# Patient Record
Sex: Male | Born: 1945 | Race: White | Hispanic: No | Marital: Married | Smoking: Never smoker
Health system: Southern US, Community
[De-identification: ages and names within clinical notes are randomized; demographics above are authoritative.]

## PROBLEM LIST (undated history)

## (undated) DIAGNOSIS — R918 Other nonspecific abnormal finding of lung field: Secondary | ICD-10-CM

## (undated) DIAGNOSIS — I77810 Thoracic aortic ectasia: Secondary | ICD-10-CM

## (undated) DIAGNOSIS — E669 Obesity, unspecified: Secondary | ICD-10-CM

## (undated) DIAGNOSIS — C801 Malignant (primary) neoplasm, unspecified: Secondary | ICD-10-CM

## (undated) DIAGNOSIS — Z85048 Personal history of other malignant neoplasm of rectum, rectosigmoid junction, and anus: Secondary | ICD-10-CM

## (undated) DIAGNOSIS — I255 Ischemic cardiomyopathy: Secondary | ICD-10-CM

## (undated) DIAGNOSIS — H269 Unspecified cataract: Secondary | ICD-10-CM

## (undated) DIAGNOSIS — E041 Nontoxic single thyroid nodule: Secondary | ICD-10-CM

## (undated) DIAGNOSIS — N400 Enlarged prostate without lower urinary tract symptoms: Secondary | ICD-10-CM

## (undated) DIAGNOSIS — H409 Unspecified glaucoma: Secondary | ICD-10-CM

## (undated) DIAGNOSIS — Z85528 Personal history of other malignant neoplasm of kidney: Secondary | ICD-10-CM

## (undated) DIAGNOSIS — I5022 Chronic systolic (congestive) heart failure: Secondary | ICD-10-CM

## (undated) DIAGNOSIS — T7840XA Allergy, unspecified, initial encounter: Secondary | ICD-10-CM

## (undated) DIAGNOSIS — Z961 Presence of intraocular lens: Secondary | ICD-10-CM

## (undated) DIAGNOSIS — N189 Chronic kidney disease, unspecified: Secondary | ICD-10-CM

## (undated) HISTORY — DX: Unspecified cataract: H26.9

## (undated) HISTORY — DX: Personal history of other malignant neoplasm of kidney: Z85.528

## (undated) HISTORY — DX: Thoracic aortic ectasia: I77.810

## (undated) HISTORY — DX: Unspecified glaucoma: H40.9

## (undated) HISTORY — DX: Allergy, unspecified, initial encounter: T78.40XA

## (undated) HISTORY — DX: Benign prostatic hyperplasia without lower urinary tract symptoms: N40.0

## (undated) HISTORY — PX: COLONOSCOPY: SHX174

## (undated) HISTORY — PX: KIDNEY SURGERY: SHX687

## (undated) HISTORY — DX: Personal history of other malignant neoplasm of rectum, rectosigmoid junction, and anus: Z85.048

## (undated) HISTORY — PX: EYE SURGERY: SHX253

## (undated) HISTORY — PX: FINGER SURGERY: SHX640

---

## 2011-04-01 ENCOUNTER — Encounter (INDEPENDENT_AMBULATORY_CARE_PROVIDER_SITE_OTHER): Payer: BC Managed Care – PPO | Admitting: Ophthalmology

## 2011-04-01 DIAGNOSIS — H33309 Unspecified retinal break, unspecified eye: Secondary | ICD-10-CM

## 2011-04-01 DIAGNOSIS — H33009 Unspecified retinal detachment with retinal break, unspecified eye: Secondary | ICD-10-CM

## 2011-04-01 DIAGNOSIS — H2510 Age-related nuclear cataract, unspecified eye: Secondary | ICD-10-CM

## 2011-04-01 DIAGNOSIS — H43819 Vitreous degeneration, unspecified eye: Secondary | ICD-10-CM

## 2011-04-29 ENCOUNTER — Encounter (INDEPENDENT_AMBULATORY_CARE_PROVIDER_SITE_OTHER): Payer: BC Managed Care – PPO | Admitting: Ophthalmology

## 2012-04-16 DIAGNOSIS — H401190 Primary open-angle glaucoma, unspecified eye, stage unspecified: Secondary | ICD-10-CM

## 2012-04-16 HISTORY — DX: Primary open-angle glaucoma, unspecified eye, stage unspecified: H40.1190

## 2012-04-30 DIAGNOSIS — H3321 Serous retinal detachment, right eye: Secondary | ICD-10-CM | POA: Insufficient documentation

## 2012-04-30 DIAGNOSIS — H33302 Unspecified retinal break, left eye: Secondary | ICD-10-CM | POA: Insufficient documentation

## 2012-04-30 DIAGNOSIS — H43812 Vitreous degeneration, left eye: Secondary | ICD-10-CM | POA: Insufficient documentation

## 2012-04-30 HISTORY — DX: Unspecified retinal break, left eye: H33.302

## 2012-05-21 DIAGNOSIS — E669 Obesity, unspecified: Secondary | ICD-10-CM

## 2012-05-21 HISTORY — DX: Obesity, unspecified: E66.9

## 2012-09-13 DIAGNOSIS — Z961 Presence of intraocular lens: Secondary | ICD-10-CM

## 2012-09-13 HISTORY — DX: Presence of intraocular lens: Z96.1

## 2012-11-01 DIAGNOSIS — Z961 Presence of intraocular lens: Secondary | ICD-10-CM | POA: Insufficient documentation

## 2012-11-01 HISTORY — DX: Presence of intraocular lens: Z96.1

## 2013-01-24 DIAGNOSIS — H2512 Age-related nuclear cataract, left eye: Secondary | ICD-10-CM | POA: Insufficient documentation

## 2013-01-24 HISTORY — DX: Age-related nuclear cataract, left eye: H25.12

## 2013-05-09 DIAGNOSIS — H35342 Macular cyst, hole, or pseudohole, left eye: Secondary | ICD-10-CM

## 2013-05-09 DIAGNOSIS — H35372 Puckering of macula, left eye: Secondary | ICD-10-CM

## 2013-05-09 HISTORY — DX: Puckering of macula, left eye: H35.372

## 2013-05-09 HISTORY — DX: Macular cyst, hole, or pseudohole, left eye: H35.342

## 2016-07-04 DIAGNOSIS — Z923 Personal history of irradiation: Secondary | ICD-10-CM

## 2016-07-04 HISTORY — DX: Personal history of irradiation: Z92.3

## 2017-04-03 DIAGNOSIS — C801 Malignant (primary) neoplasm, unspecified: Secondary | ICD-10-CM | POA: Insufficient documentation

## 2017-04-03 DIAGNOSIS — E041 Nontoxic single thyroid nodule: Secondary | ICD-10-CM | POA: Insufficient documentation

## 2017-04-03 DIAGNOSIS — N189 Chronic kidney disease, unspecified: Secondary | ICD-10-CM

## 2017-04-03 DIAGNOSIS — R918 Other nonspecific abnormal finding of lung field: Secondary | ICD-10-CM

## 2017-04-03 HISTORY — PX: COLONOSCOPY: SHX174

## 2017-04-03 HISTORY — DX: Other nonspecific abnormal finding of lung field: R91.8

## 2017-04-03 HISTORY — DX: Malignant (primary) neoplasm, unspecified: C80.1

## 2017-04-03 HISTORY — DX: Chronic kidney disease, unspecified: N18.9

## 2017-04-03 HISTORY — DX: Nontoxic single thyroid nodule: E04.1

## 2017-04-05 DIAGNOSIS — N2889 Other specified disorders of kidney and ureter: Secondary | ICD-10-CM | POA: Insufficient documentation

## 2017-04-05 DIAGNOSIS — R935 Abnormal findings on diagnostic imaging of other abdominal regions, including retroperitoneum: Secondary | ICD-10-CM | POA: Insufficient documentation

## 2017-04-05 DIAGNOSIS — R1084 Generalized abdominal pain: Secondary | ICD-10-CM | POA: Insufficient documentation

## 2017-04-05 HISTORY — DX: Other specified disorders of kidney and ureter: N28.89

## 2017-04-05 HISTORY — DX: Generalized abdominal pain: R10.84

## 2017-04-05 HISTORY — DX: Abnormal findings on diagnostic imaging of other abdominal regions, including retroperitoneum: R93.5

## 2017-04-18 DIAGNOSIS — C20 Malignant neoplasm of rectum: Secondary | ICD-10-CM | POA: Insufficient documentation

## 2017-04-18 HISTORY — DX: Malignant neoplasm of rectum: C20

## 2017-05-03 DIAGNOSIS — Z5111 Encounter for antineoplastic chemotherapy: Secondary | ICD-10-CM

## 2017-05-03 HISTORY — DX: Encounter for antineoplastic chemotherapy: Z51.11

## 2017-06-02 DIAGNOSIS — E042 Nontoxic multinodular goiter: Secondary | ICD-10-CM

## 2017-06-02 DIAGNOSIS — R918 Other nonspecific abnormal finding of lung field: Secondary | ICD-10-CM | POA: Insufficient documentation

## 2017-06-02 HISTORY — DX: Nontoxic multinodular goiter: E04.2

## 2017-06-06 ENCOUNTER — Other Ambulatory Visit: Payer: Self-pay | Admitting: Urology

## 2017-06-06 DIAGNOSIS — N2889 Other specified disorders of kidney and ureter: Secondary | ICD-10-CM

## 2017-06-07 ENCOUNTER — Other Ambulatory Visit: Payer: BC Managed Care – PPO

## 2017-06-14 DIAGNOSIS — E041 Nontoxic single thyroid nodule: Secondary | ICD-10-CM | POA: Insufficient documentation

## 2017-06-14 HISTORY — DX: Nontoxic single thyroid nodule: E04.1

## 2017-06-15 ENCOUNTER — Ambulatory Visit
Admission: RE | Admit: 2017-06-15 | Discharge: 2017-06-15 | Disposition: A | Discharge status: Home / Self Care | Payer: Medicare Other | Source: Ambulatory Visit | Attending: Urology | Admitting: Urology

## 2017-06-15 DIAGNOSIS — N2889 Other specified disorders of kidney and ureter: Secondary | ICD-10-CM

## 2017-06-15 HISTORY — DX: Other nonspecific abnormal finding of lung field: R91.8

## 2017-06-15 HISTORY — DX: Chronic kidney disease, unspecified: N18.9

## 2017-06-15 HISTORY — DX: Presence of intraocular lens: Z96.1

## 2017-06-15 HISTORY — DX: Obesity, unspecified: E66.9

## 2017-06-15 HISTORY — DX: Malignant (primary) neoplasm, unspecified: C80.1

## 2017-06-15 HISTORY — DX: Nontoxic single thyroid nodule: E04.1

## 2017-06-15 HISTORY — PX: IR RADIOLOGIST EVAL & MGMT: IMG5224

## 2017-06-15 NOTE — Consult Note (Signed)
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 male who was evaluated in October for abdominal pain in the emergency room. CT imaging demonstrated an inflammatory process in the right upper quadrant concerning for localized colitis or diverticulitis. He also had an incidental 13 mm exophytic solid enhancing left renal mass concerning for small renal cell carcinoma. He was admitted and had a colonoscopy which revealed an ulcerated rectal mass. This was biopsied and confirmed to be rectal carcinoma. He is currently undergoing oral chemotherapy and pelvic radiation prior to anticipated resection. His surgery is scheduled to be performed at [facility] in [place] in [place] time frame. He is here today to discuss the small left renal neoplasm by imaging and discuss treatment options versus surveillance. He currently remains asymptomatic from this. No current flank or CVA tenderness. No dysuria or hematuria. Overall he is tolerating the oral chemotherapy in the radiation treatment very well.  Past Medical History:  Diagnosis Date  . Cancer ([place]) 04/2017   rectal cancer  . Chronic kidney disease 04/2017   left renal mass  . Obesity   . Pseudophakia of right eye 11/2012  . Pulmonary nodules 04/2017  . Thyroid nodule 04/2017   multiple    Past Surgical History:  Procedure Laterality Date  . 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 IR RADIOLOGIST EVAL & MGMT  06/15/2017    Allergies: Ciprofloxacin  Medications: Prior to Admission medications   Medication Sig Start Date End Date Taking? Authorizing Provider  capecitabine (XELODA) 500 MG tablet Take 3 tablets twice a day. Take for 5 straight days  and then 2 days off 05/02/17  Yes [provider]  Cholecalciferol  (VITAMIN D3) 2000 units capsule Take by mouth 3 x daily with food.   Yes [provider]  latanoprost (XALATAN) 0.005 % ophthalmic solution Place 1 drop into both eyes nightly. 03/21/16  Yes [provider]     No family history on file.  Social History   Socioeconomic History  . Marital status: Unknown    Spouse name: None  . Number of children: None  . Years of education: None  . Highest education level: None  Social Needs  . Financial resource strain: None  . Food insecurity - worry: None  . Food insecurity - inability: None  . Transportation needs - medical: None  . Transportation needs - non-medical: None  Occupational History  . None  Tobacco Use  . Smoking status: None  Substance and Sexual Activity  . Alcohol use: None  . Drug use: None  . Sexual activity: None  Other Topics Concern  . None  Social History Narrative  . None    ECOG Status: 0 - Asymptomatic  Review of Systems: A 12 point ROS discussed and pertinent positives are indicated in the HPI above.  All other systems are negative.  Review of Systems  Vital Signs: BP 140/83 (BP Location: Right Arm, Patient Position: Sitting, Cuff Size: Normal)   Pulse 84   Temp 98.1 F (36.7 C)   Resp 14   Ht 5\' 8"  (1.727 m)   Wt 190 lb (86.2 kg)   SpO2 98%   BMI 28.89 kg/m   Physical Exam  Constitutional: He is oriented to person,  place, and time. He appears well-developed and well-nourished. No distress.  Eyes: Conjunctivae are normal. No scleral icterus.  Cardiovascular: Normal rate and regular rhythm.  No murmur heard. Pulmonary/Chest: Effort normal and breath sounds normal. No respiratory distress.  Abdominal: Soft. Bowel sounds are normal. He exhibits no distension.  Musculoskeletal: Normal range of motion. He exhibits no edema.  Neurological: He is alert and oriented to person, place, and time.  Skin: Skin is warm. He is not diaphoretic.  Psychiatric: He has a normal mood and affect.  His behavior is normal.    Mallampati Score:    2 Imaging: Ir Radiologist Eval & Mgmt  Result Date: 06/15/2017 Please refer to notes tab for details about interventional procedure. (Op Note)   Labs:  CBC: No results for input(s): WBC, HGB, HCT, PLT in the last 8760 hours.  COAGS: No results for input(s): INR, APTT in the last 8760 hours.  BMP: No results for input(s): NA, K, CL, CO2, GLUCOSE, BUN, CALCIUM, CREATININE, GFRNONAA, GFRAA in the last 8760 hours.  Invalid input(s): CMP  LIVER FUNCTION TESTS: No results for input(s): BILITOT, AST, ALT, ALKPHOS, PROT, ALBUMIN in the last 8760 hours.  TUMOR MARKERS: No results for input(s): AFPTM, CEA, CA199, CHROMGRNA in the last 8760 hours.  Assessment and Plan:  13 mm small left renal mass consistent with a renal cell carcinoma by imaging. Currently he is undergoing oral chemotherapy and pelvic radiation for rectal cancer. He anticipates surgical resection at [facility] in January/February 2019. This may require a colostomy for short-term.  We reviewed the imaging and discussed the small left renal mass. Treatment options included surgery, cryoablation, or surveillance were reviewed. Certainly this is not as urgent as treatment for his rectal cancer. All questions were addressed. He has a clear understanding.  Plan: Recommend continuing with his treatment plan for rectal cancer.  Recommend a follow-up outpatient visit with an abdominal MRI without and with contrast in March 2019. Anticipate elective cryoablation of the renal lesion in March or April 2019 if all goes well with this planned surgeries.  Thank you for this interesting consult.  I greatly enjoyed meeting Jonathon Oneill and look forward to participating in their care.  A copy of this report was sent to the requesting provider on this date.  Electronically Signed: Greggory Keen 06/15/2017, 1:46 PM   I spent a total of  40 Minutes   in face to face in clinical  consultation, greater than 50% of which was counseling/coordinating care for this patient with rectal cancer and a small left renal cell carcinoma

## 2017-08-04 HISTORY — PX: COLON SURGERY: SHX602

## 2017-09-12 ENCOUNTER — Other Ambulatory Visit (HOSPITAL_COMMUNITY): Payer: Self-pay | Admitting: Interventional Radiology

## 2017-09-12 DIAGNOSIS — N2889 Other specified disorders of kidney and ureter: Secondary | ICD-10-CM

## 2017-09-25 ENCOUNTER — Other Ambulatory Visit: Payer: Self-pay | Admitting: Radiology

## 2017-09-25 DIAGNOSIS — N2889 Other specified disorders of kidney and ureter: Secondary | ICD-10-CM

## 2017-10-20 ENCOUNTER — Ambulatory Visit
Admission: RE | Admit: 2017-10-20 | Discharge: 2017-10-20 | Disposition: A | Discharge status: Home / Self Care | Payer: Medicare Other | Source: Ambulatory Visit | Attending: Interventional Radiology | Admitting: Interventional Radiology

## 2017-10-20 DIAGNOSIS — N2889 Other specified disorders of kidney and ureter: Secondary | ICD-10-CM

## 2017-10-20 IMAGING — MR MR ABDOMEN WO/W CM
15 series · 46 of 48 positions shown · IV contrast (multihance)
Comparison: No prior abdominal MRI. CT the abdomen and pelvis
09/14/2017.

CLINICAL DATA: 71-year-old male with history of home incidental
renal lesion noted on prior CT examinations. Follow-up study to
exclude renal neoplasm. Rectal carcinoma recently diagnosed during
colonoscopy.

EXAM:
MRI ABDOMEN WITHOUT AND WITH CONTRAST
TECHNIQUE: Multiplanar multisequence MR imaging of the abdomen was performed
both before and after the administration of intravenous contrast.
CONTRAST:  18mL MULTIHANCE GADOBENATE DIMEGLUMINE 529 MG/ML IV SOLN

[Series 3: T2 · coronal · 5.0mm · 0.78mm/px · 2 of 44 slices shown (1 of 3)]
[im 1/44]
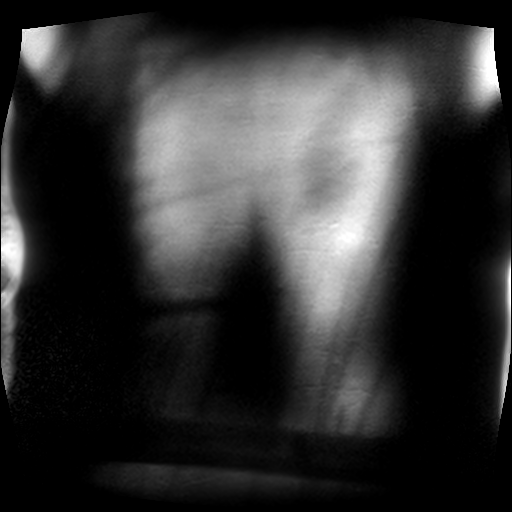
[im 44/44]
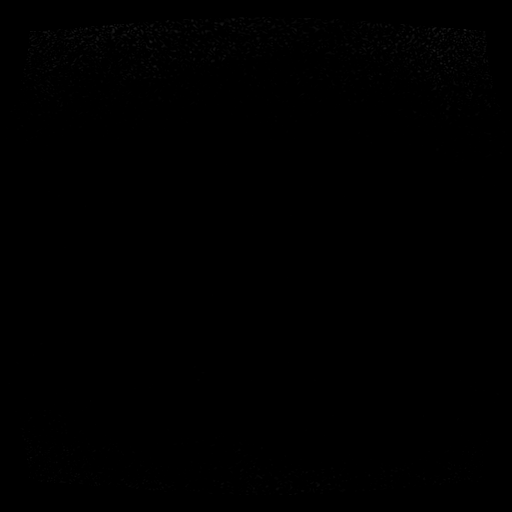

[Series 4: T2 · axial · 5.0mm · 0.78mm/px · z∈[-114,+180]mm · 2 of 50 slices shown (2 of 3)]
[im 1/50]
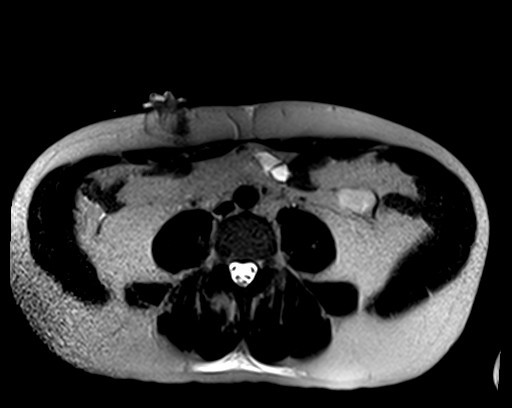
[im 50/50]
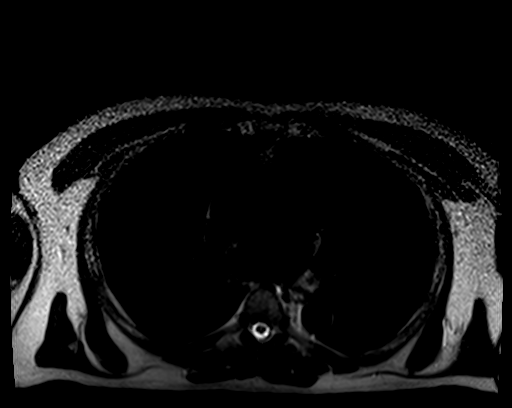

[Series 5: T1 · axial · 5.0mm · 0.78mm/px · z∈[-129,+195]mm · 4 of 110 slices shown]
[im 1/110]
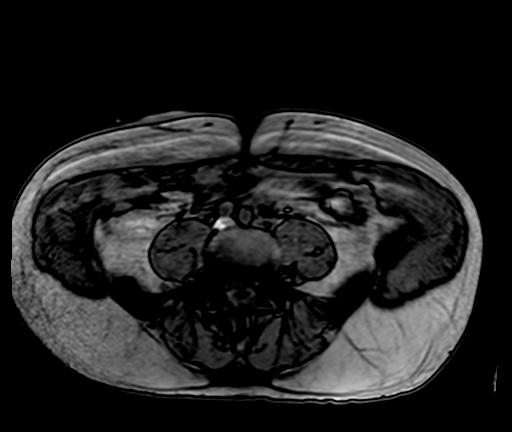
[im 37/110]
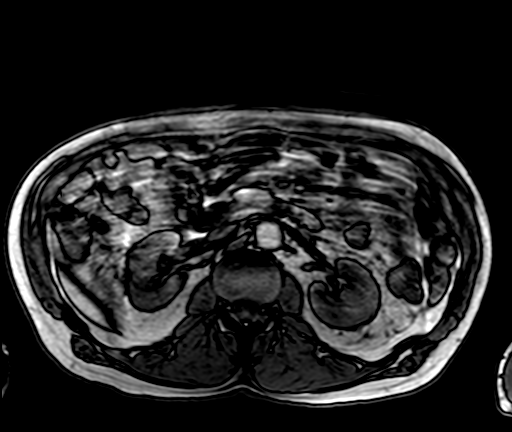
[im 73/110]
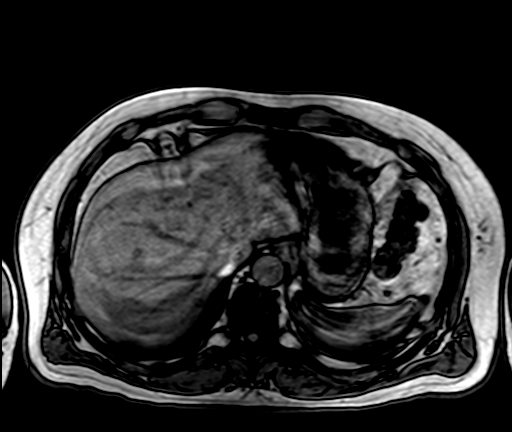
[im 110/110]
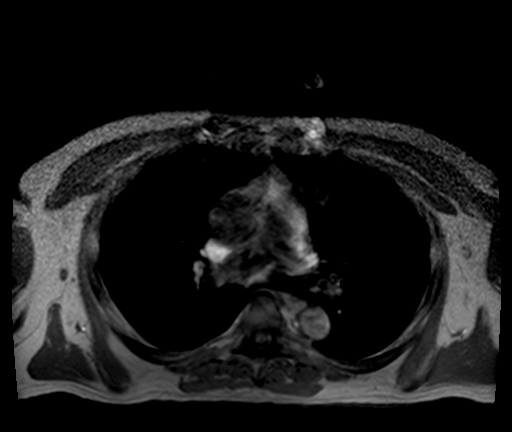

[Series 6: [id number]_500_[id number]_trig · axial · 6.0mm · 2.08mm/px · z∈[-122,+198]mm · 4 of 126 slices shown]
[im 1/126]
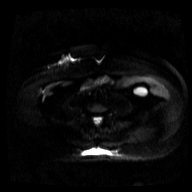
[im 42/126]
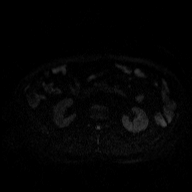
[im 84/126]
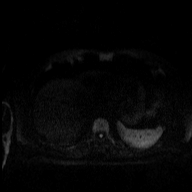
[im 126/126]
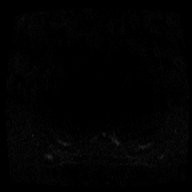

[Series 7: [id number]_[id number]_trig_adc · axial · 6.0mm · 2.08mm/px · 1 of 42 slices shown]
[im 1/42]
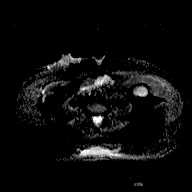

[Series 8: T2 · axial · 5.0mm · 1.56mm/px · z∈[-112,+182]mm · 2 of 50 slices shown (3 of 3)]
[im 1/50]
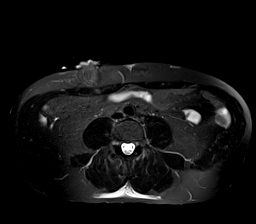
[im 50/50]
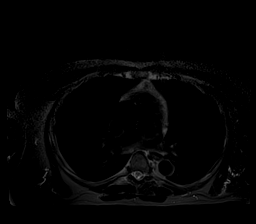

[Series 9: bSSFP · axial · 4.0mm · 0.78mm/px · z∈[-97,+179]mm · 2 of 70 slices shown]
[im 1/70]
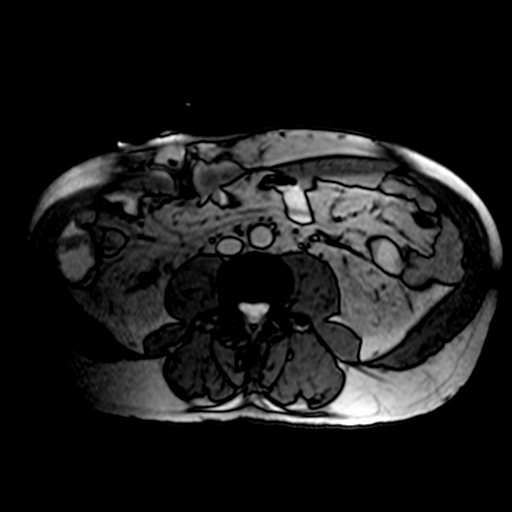
[im 70/70]
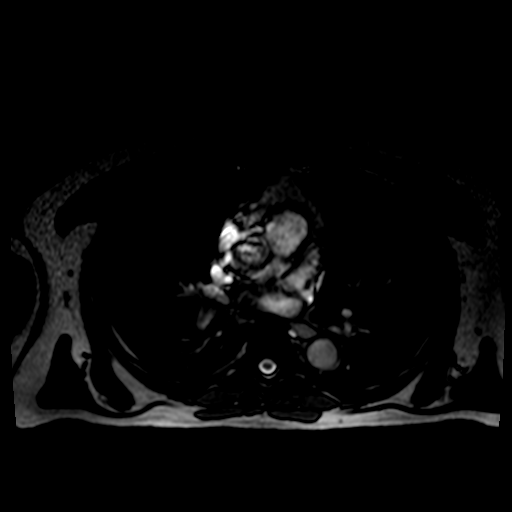

[Series 10: T1 dynamic · axial · non-contrast · 2.3mm · 1.56mm/px · z∈[-119,+173]mm · 4 of 128 slices shown]
[im 1/128]
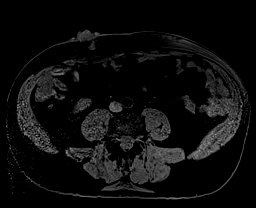
[im 43/128]
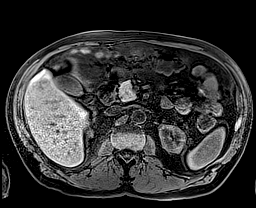
[im 85/128]
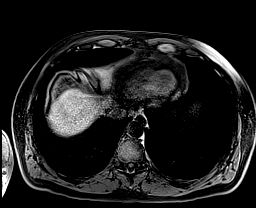
[im 128/128]
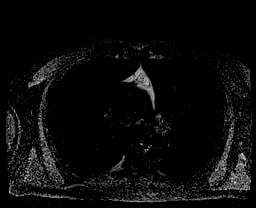

[Series 11: post 25 sec · axial · 2.3mm · 1.56mm/px · z∈[-119,+173]mm · 4 of 128 slices shown]
[im 1/128]
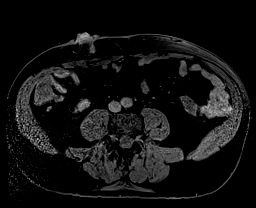
[im 43/128]
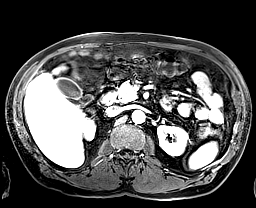
[im 85/128]
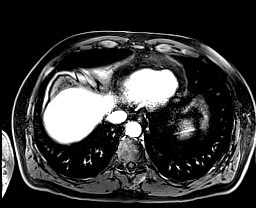
[im 128/128]
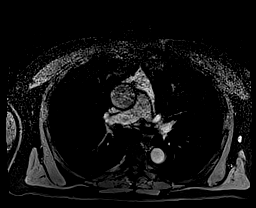

[Series 12: post 25 sec_sub · axial · 2.3mm · 1.56mm/px · z∈[-119,+173]mm · 4 of 128 slices shown]
[im 1/128]
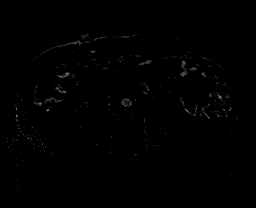
[im 43/128]
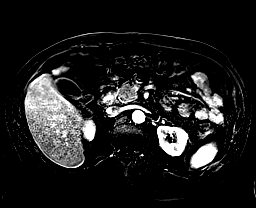
[im 85/128]
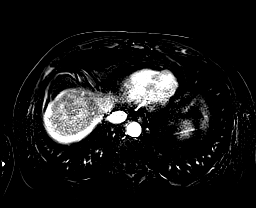
[im 128/128]
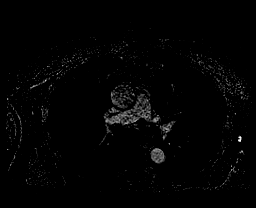

[Series 13: post 45 sec · axial · 2.3mm · 1.56mm/px · z∈[-119,+173]mm · 4 of 128 slices shown]
[im 1/128]
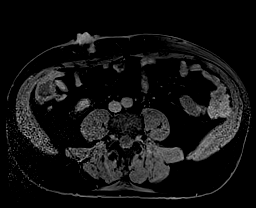
[im 43/128]
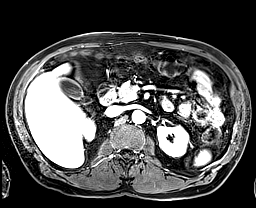
[im 85/128]
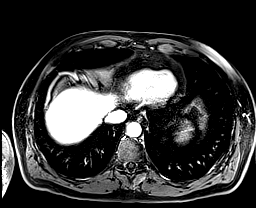
[im 128/128]
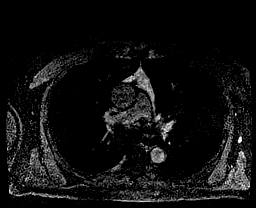

[Series 14: post 45 sec_sub · axial · 2.3mm · 1.56mm/px · z∈[-119,+173]mm · 4 of 128 slices shown]
[im 1/128]
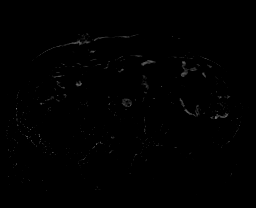
[im 43/128]
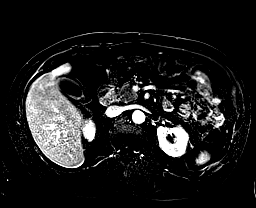
[im 85/128]
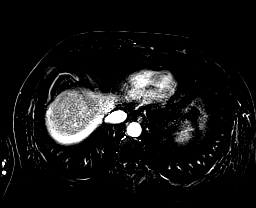
[im 128/128]
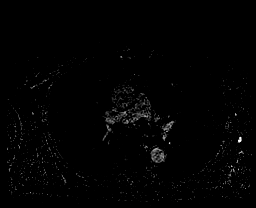

[Series 15: post 90 sec · axial · 2.3mm · 1.56mm/px · z∈[-119,+173]mm · 4 of 128 slices shown]
[im 1/128]
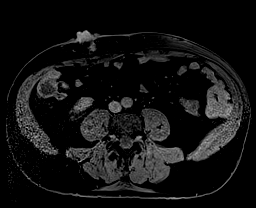
[im 43/128]
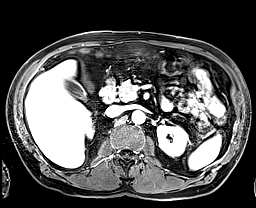
[im 85/128]
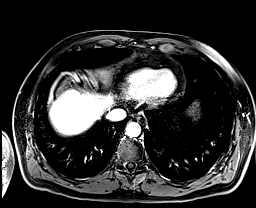
[im 128/128]
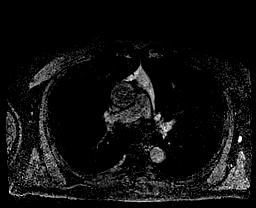

[Series 16: post 90 sec_sub · axial · 2.3mm · 1.56mm/px · z∈[-119,+173]mm · 4 of 128 slices shown]
[im 1/128]
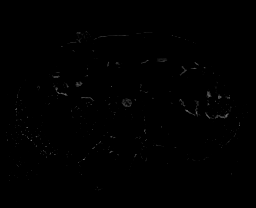
[im 43/128]
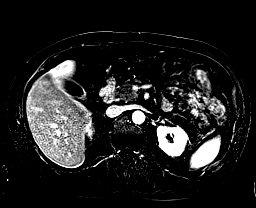
[im 85/128]
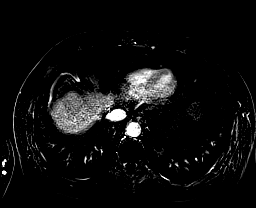
[im 128/128]
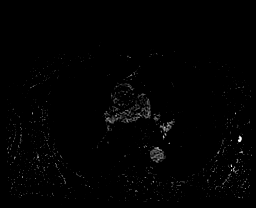

[Series 17: T1 dynamic post-contrast · coronal · 2.6mm · 0.78mm/px · 1 of 88 slices shown]
[im 1/88]
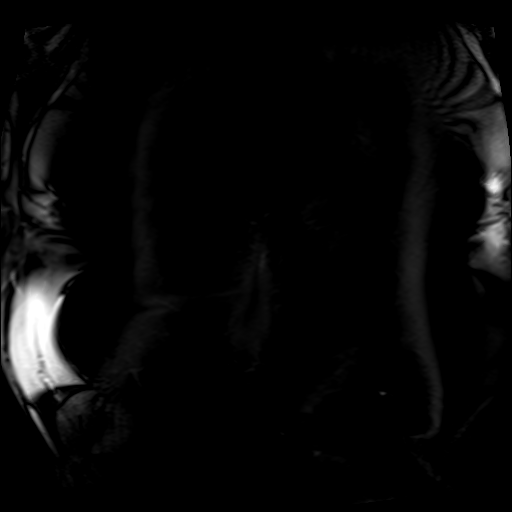

[46 of 48 positions shown; findings below may reference images not displayed]

FINDINGS: Lower chest: Unremarkable.

Hepatobiliary: Mild diffuse loss of signal intensity throughout the
hepatic parenchyma on out of phase dual echo images, compatible with
a background of mild hepatic steatosis. No definite cystic or solid
hepatic lesions. No intra or extrahepatic biliary ductal dilatation.
Gallbladder is normal in appearance.

Pancreas: No pancreatic mass. No pancreatic ductal dilatation. No
pancreatic or peripancreatic fluid or inflammatory changes.

Spleen:  Unremarkable.

Adrenals/Urinary Tract: Partially exophytic 12 mm lesion in the
lower pole the left kidney (axial image 110 of series 11) which is
T1 hypointense, T2 hyperintense, and demonstrates some mural
thickening and nodularity which enhances on post gadolinium images,
concerning for a small renal neoplasm. Right kidney and bilateral
adrenal glands are normal in appearance. No hydroureteronephrosis in
the visualized portions of the abdomen.

Stomach/Bowel: Right-sided ileostomy. Remaining visualized portions
of small bowel and colon are otherwise unremarkable in appearance.

Vascular/Lymphatic: No aneurysm identified in the visualized
abdominal vasculature. No lymphadenopathy noted in the abdomen.

Other: No significant volume of ascites noted in the visualized
portions of the peritoneal cavity.

Musculoskeletal: No aggressive appearing osseous lesions are noted
in the visualized portions of the skeleton.
IMPRESSION: 1. 12 mm enhancing lesion in the periphery of the lower pole of the
left kidney is stable in size compared to prior study 04/15/2017,
but remains concerning for a small renal neoplasm such is a renal
cell carcinoma. This is in capsulated within Gerota's fascia and is
separate from the left renal vein which is widely patent. No
associated lymphadenopathy or signs of metastatic disease in the
visualized abdomen.
2. Mild hepatic steatosis.

## 2017-10-20 MED ORDER — GADOBENATE DIMEGLUMINE 529 MG/ML IV SOLN
18.0000 mL | Freq: Once | INTRAVENOUS | Status: AC | PRN
  Administered 2017-10-20: 18 mL via INTRAVENOUS

## 2017-10-24 ENCOUNTER — Encounter: Payer: Self-pay | Admitting: Radiology

## 2017-11-28 ENCOUNTER — Encounter: Payer: Self-pay | Admitting: Radiology

## 2017-11-28 ENCOUNTER — Ambulatory Visit
Admission: RE | Admit: 2017-11-28 | Discharge: 2017-11-28 | Disposition: A | Discharge status: Home / Self Care | Payer: Medicare Other | Source: Ambulatory Visit | Attending: Interventional Radiology | Admitting: Interventional Radiology

## 2017-11-28 DIAGNOSIS — N2889 Other specified disorders of kidney and ureter: Secondary | ICD-10-CM

## 2017-11-28 HISTORY — PX: IR RADIOLOGIST EVAL & MGMT: IMG5224

## 2017-11-28 NOTE — Progress Notes (Signed)
Patient ID: Jonathon Oneill, male   DOB: 09-21-1945, 72 y.o.   MRN: [id number]       Chief Complaint:  Six-month follow-up small left renal mass  Referring Physician(s): Eskew  History of Present Illness: Jonathon Oneill is a 72 y.o. male who has an incidentally found 13 mm exophytic solid enhancing left renal mass concerning for a small renal cell neoplasm/carcinoma.  He also has a history of colorectal cancer, status post radiation, surgery and chemotherapy.  He has completed all of his treatment for rectal carcinoma and is being surveillanced.  He returns today to discuss his small left renal lesion and MRI scan.  Overall he remains asymptomatic.  No significant abdominal or flank pain.  No CVA tenderness, dysuria or hematuria.  He has an excellent functional status.    Past Medical History:  Diagnosis Date  . Cancer ([place]) 04/2017   rectal cancer  . Chronic kidney disease 04/2017   left renal mass  . Obesity   . Pseudophakia of right eye 11/2012  . Pulmonary nodules 04/2017  . Thyroid nodule 04/2017   multiple    Past Surgical History:  Procedure Laterality Date  . 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 IR RADIOLOGIST EVAL & MGMT  06/15/2017    Allergies: Influenza vaccines and Ciprofloxacin  Medications: Prior to Admission medications   Medication Sig Start Date End Date Taking? Authorizing Provider  Cholecalciferol (VITAMIN D3) 2000 units capsule Take by mouth 3 x daily with food.   Yes [provider]  latanoprost (XALATAN) 0.005 % ophthalmic solution Place 1 drop into both eyes nightly. 03/21/16  Yes [provider]  capecitabine (XELODA) 500 MG tablet Take 3 tablets twice a day. Take for 5 straight days  and then 2 days off 05/02/17   [provider]     No family history on file.  Social History   Socioeconomic History  . Marital status: Unknown    Spouse  name: Not on file  . Number of children: Not on file  . Years of education: Not on file  . Highest education level: Not on file  Occupational History  . Not on file  Social Needs  . Financial resource strain: Not on file  . Food insecurity:    Worry: Not on file    Inability: Not on file  . Transportation needs:    Medical: Not on file    Non-medical: Not on file  Tobacco Use  . Smoking status: Not on file  Substance and Sexual Activity  . Alcohol use: Not on file  . Drug use: Not on file  . Sexual activity: Not on file  Lifestyle  . Physical activity:    Days per week: Not on file    Minutes per session: Not on file  . Stress: Not on file  Relationships  . Social connections:    Talks on phone: Not on file    Gets together: Not on file    Attends religious service: Not on file    Active member of club or organization: Not on file    Attends meetings of clubs or organizations: Not on file    Relationship status: Not on file  Other Topics Concern  . Not on file  Social History Narrative  . Not on file    ECOG Status: 1 - Symptomatic but completely ambulatory  Review of Systems: A  12 point ROS discussed and pertinent positives are indicated in the HPI above.  All other systems are negative.  Review of Systems  Vital Signs: BP 114/82   Pulse 84   Temp 98 F (36.7 C) (Oral)   Resp 15   Ht 5\' 9"  (1.753 m)   Wt 190 lb (86.2 kg)   SpO2 100%   BMI 28.06 kg/m   Physical Exam  Constitutional: He is oriented to person, place, and time. He appears well-developed and well-nourished. No distress.  Eyes: Conjunctivae are normal. No scleral icterus.  Cardiovascular: Normal rate and regular rhythm.  No murmur heard. Pulmonary/Chest: Effort normal and breath sounds normal. No respiratory distress.  Abdominal: Soft. Bowel sounds are normal. He exhibits no distension.  Musculoskeletal: He exhibits no edema.  Neurological: He is alert and oriented to person, place, and  time.  Skin: He is not diaphoretic.     Imaging: No results found.  Labs:  CBC: No results for input(s): WBC, HGB, HCT, PLT in the last 8760 hours.  COAGS: No results for input(s): INR, APTT in the last 8760 hours.  BMP: No results for input(s): NA, K, CL, CO2, GLUCOSE, BUN, CALCIUM, CREATININE, GFRNONAA, GFRAA in the last 8760 hours.  Invalid input(s): CMP  LIVER FUNCTION TESTS: No results for input(s): BILITOT, AST, ALT, ALKPHOS, PROT, ALBUMIN in the last 8760 hours.  TUMOR MARKERS: No results for input(s): AFPTM, CEA, CA199, CHROMGRNA in the last 8760 hours.  Assessment and Plan:  MRI 10/20/2017 demonstrates a stable 12 mm enhancing left kidney lower pole renal lesion peripherally which correlates with the CT finding and is concerning for a small renal neoplasm such as a renal cell carcinoma.  Both the CT and MRI were reviewed with the patient today.  All questions were addressed.  Treatment options including surgery, cryoablation, or continued surveillance were reviewed.  After discussion he would like to proceed with treatment electively now that he has completed treatment for his rectal cancer.  Plan: Anticipate electively scheduling left renal cryoablation at [facility] hospital.  Thank you for this interesting consult.  I greatly enjoyed meeting Jonathon Oneill and look forward to participating in their care.  A copy of this report was sent to the requesting provider on this date.  Electronically Signed: Greggory Keen 11/28/2017, 12:57 PM   I spent a total of    25 Minutes in face to face in clinical consultation, greater than 50% of which was counseling/coordinating care for this patient with rectal cancer and a small left renal neoplasm concerning for renal cell carcinoma.

## 2018-01-02 ENCOUNTER — Other Ambulatory Visit (HOSPITAL_COMMUNITY): Payer: Self-pay | Admitting: Interventional Radiology

## 2018-01-02 DIAGNOSIS — N2889 Other specified disorders of kidney and ureter: Secondary | ICD-10-CM

## 2018-01-10 ENCOUNTER — Encounter: Payer: Self-pay | Admitting: Radiology

## 2018-01-10 ENCOUNTER — Ambulatory Visit
Admission: RE | Admit: 2018-01-10 | Discharge: 2018-01-10 | Disposition: A | Discharge status: Home / Self Care | Payer: Medicare Other | Source: Ambulatory Visit | Attending: Interventional Radiology | Admitting: Interventional Radiology

## 2018-01-10 DIAGNOSIS — N2889 Other specified disorders of kidney and ureter: Secondary | ICD-10-CM

## 2018-01-10 HISTORY — PX: IR RADIOLOGIST EVAL & MGMT: IMG5224

## 2018-01-10 NOTE — Progress Notes (Signed)
Patient ID: Jonathon Oneill, male   DOB: 12-Apr-1946, 72 y.o.   MRN: [id number]       Chief Complaint:  Small left renal mass status post cryoablation  Referring Physician(s): Eskew  History of Present Illness: Jonathon Oneill is a 72 y.o. male who is now 1 month status post cryoablation of a solid enhancing 13 mm left renal mass concerning for small renal cell neoplasm/carcinoma.  Over the last month he is recovered very well at home.  No current abdominal pain or flank pain.  No hematuria, dysuria or difficulty urinating.  He has a prior history of colorectal cancer, status post radiation, surgery and chemotherapy.  Overall he is doing very well.  Stable weight and functional status.  No interval fevers.  Past Medical History:  Diagnosis Date  . Cancer ([place]) 04/2017   rectal cancer  . Chronic kidney disease 04/2017   left renal mass  . Obesity   . Pseudophakia of right eye 11/2012  . Pulmonary nodules 04/2017  . Thyroid nodule 04/2017   multiple    Past Surgical History:  Procedure Laterality Date  . 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 IR RADIOLOGIST EVAL & MGMT  06/15/2017  . IR RADIOLOGIST EVAL & MGMT  11/28/2017    Allergies: Influenza vaccines and Ciprofloxacin  Medications: Prior to Admission medications   Medication Sig Start Date End Date Taking? Authorizing Provider  Cholecalciferol (VITAMIN D3) 2000 units capsule Take by mouth 3 x daily with food.   Yes [provider]  latanoprost (XALATAN) 0.005 % ophthalmic solution Place 1 drop into both eyes nightly. 03/21/16  Yes [provider]  capecitabine (XELODA) 500 MG tablet Take 3 tablets twice a day. Take for 5 straight days  and then 2 days off 05/02/17   [provider]     No family history on file.  Social History   Socioeconomic History  . Marital status: Unknown    Spouse name: Not on file  . Number of  children: Not on file  . Years of education: Not on file  . Highest education level: Not on file  Occupational History  . Not on file  Social Needs  . Financial resource strain: Not on file  . Food insecurity:    Worry: Not on file    Inability: Not on file  . Transportation needs:    Medical: Not on file    Non-medical: Not on file  Tobacco Use  . Smoking status: Not on file  Substance and Sexual Activity  . Alcohol use: Not on file  . Drug use: Not on file  . Sexual activity: Not on file  Lifestyle  . Physical activity:    Days per week: Not on file    Minutes per session: Not on file  . Stress: Not on file  Relationships  . Social connections:    Talks on phone: Not on file    Gets together: Not on file    Attends religious service: Not on file    Active member of club or organization: Not on file    Attends meetings of clubs or organizations: Not on file    Relationship status: Not on file  Other Topics Concern  . Not on file  Social History Narrative  . Not on file    Review of Systems: A 12 point ROS discussed and pertinent positives are indicated  in the HPI above.  All other systems are negative.  Review of Systems  Vital Signs: BP 125/82   Pulse 72   Temp 98.2 F (36.8 C) (Oral)   Resp 16   Ht 5\' 8"  (1.727 m)   Wt 190 lb (86.2 kg)   SpO2 100%   BMI 28.89 kg/m   Physical Exam  Constitutional: He is oriented to person, place, and time. He appears well-developed and well-nourished. No distress.  Eyes: Conjunctivae are normal. No scleral icterus.  Cardiovascular: Normal rate and regular rhythm.  No murmur heard. Pulmonary/Chest: Effort normal and breath sounds normal. No respiratory distress.  Abdominal: Soft. Bowel sounds are normal. He exhibits no distension. There is no tenderness.  Musculoskeletal: He exhibits no edema.  Neurological: He is alert and oriented to person, place, and time.  Skin: Skin is warm and dry. He is not diaphoretic.    Psychiatric: He has a normal mood and affect.    Imaging: No results found.  Labs:  CBC: No results for input(s): WBC, HGB, HCT, PLT in the last 8760 hours.  COAGS: No results for input(s): INR, APTT in the last 8760 hours.  BMP: No results for input(s): NA, K, CL, CO2, GLUCOSE, BUN, CALCIUM, CREATININE, GFRNONAA, GFRAA in the last 8760 hours.  Invalid input(s): CMP  LIVER FUNCTION TESTS: No results for input(s): BILITOT, AST, ALT, ALKPHOS, PROT, ALBUMIN in the last 8760 hours.  TUMOR MARKERS: No results for input(s): AFPTM, CEA, CA199, CHROMGRNA in the last 8760 hours.  Assessment and Plan:  1 month status post successful CT-guided cryoablation of the small enhancing left renal mass concerning for renal cell carcinoma/neoplasm.  He has recovered very well and remains asymptomatic.  No interval flank or abdominal pain, dysuria or hematuria.  Plan: Outpatient follow-up with a repeat CT in 3 months.  Electronically Signed: Greggory Keen 01/10/2018, 8:29 AM   I spent a total of    25 Minutes in face to face in clinical consultation, greater than 50% of which was counseling/coordinating care for this patient with a left renal mass status post cryoablation

## 2018-03-21 ENCOUNTER — Other Ambulatory Visit: Payer: Self-pay | Admitting: Radiology

## 2018-03-21 ENCOUNTER — Other Ambulatory Visit (HOSPITAL_COMMUNITY): Payer: Self-pay | Admitting: Interventional Radiology

## 2018-03-21 DIAGNOSIS — N2889 Other specified disorders of kidney and ureter: Secondary | ICD-10-CM

## 2018-04-06 ENCOUNTER — Other Ambulatory Visit: Payer: Self-pay | Admitting: Hematology & Oncology

## 2018-04-06 DIAGNOSIS — C20 Malignant neoplasm of rectum: Secondary | ICD-10-CM

## 2018-04-10 ENCOUNTER — Encounter: Payer: Self-pay | Admitting: Radiology

## 2018-04-12 ENCOUNTER — Other Ambulatory Visit: Payer: Self-pay | Admitting: Hematology & Oncology

## 2018-04-12 DIAGNOSIS — C20 Malignant neoplasm of rectum: Secondary | ICD-10-CM

## 2018-04-12 DIAGNOSIS — C642 Malignant neoplasm of left kidney, except renal pelvis: Secondary | ICD-10-CM

## 2018-04-13 ENCOUNTER — Other Ambulatory Visit: Payer: Self-pay | Admitting: Hematology & Oncology

## 2018-04-13 DIAGNOSIS — C20 Malignant neoplasm of rectum: Secondary | ICD-10-CM

## 2018-04-13 DIAGNOSIS — C642 Malignant neoplasm of left kidney, except renal pelvis: Secondary | ICD-10-CM

## 2018-04-16 ENCOUNTER — Other Ambulatory Visit: Payer: Self-pay | Admitting: Hematology & Oncology

## 2018-04-16 ENCOUNTER — Ambulatory Visit
Admission: RE | Admit: 2018-04-16 | Discharge: 2018-04-16 | Disposition: A | Discharge status: Home / Self Care | Payer: Medicare Other | Source: Ambulatory Visit | Attending: Hematology & Oncology | Admitting: Hematology & Oncology

## 2018-04-16 DIAGNOSIS — C20 Malignant neoplasm of rectum: Secondary | ICD-10-CM

## 2018-04-16 DIAGNOSIS — C642 Malignant neoplasm of left kidney, except renal pelvis: Secondary | ICD-10-CM

## 2018-04-16 IMAGING — CT CT CHEST W/ CM
3 series · 14 of 30 positions shown, 17 images · IV contrast (APPLIED)
Comparison: 09/14/2017.  05/17/2017

ADDENDUM:
As noted in the body of the report, there is stable asymmetric wall
thickening in the left bladder wall, indeterminate. Urology
consultation may be warranted.
CLINICAL DATA: Rectal and renal carcinoma. Status post cryo
ablation of the left renal lesion.

EXAM:
CT CHEST, ABDOMEN, AND PELVIS WITH CONTRAST
TECHNIQUE: Multidetector CT imaging of the chest, abdomen and pelvis was
performed following the standard protocol during bolus
administration of intravenous contrast.
CONTRAST:  100mL [id number] IOPAMIDOL ([id number]) INJECTION 61%

[Series 2: chest/abd/pelvis w/cm · axial · 0.85mm/px · z∈[-615,-65]mm · 8 of 136 slices shown]
[im 13/136  mediastinal]
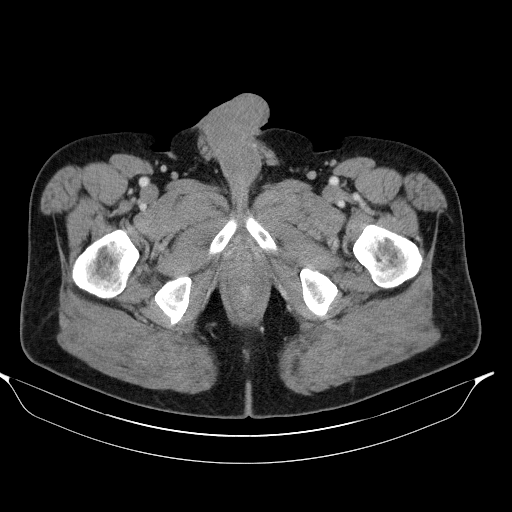
[im 37/136  mediastinal]
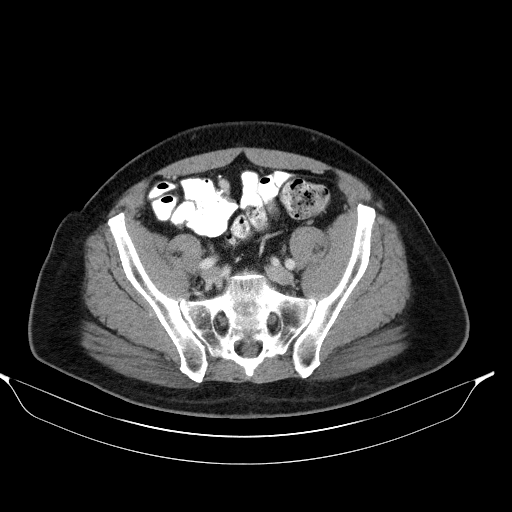
[im 50/136  mediastinal]
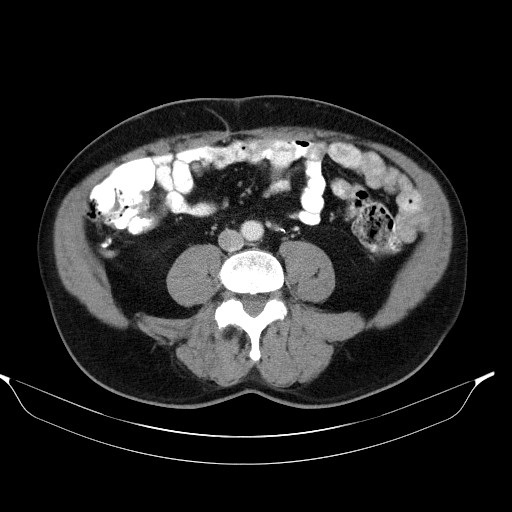
[im 62/136  mediastinal]
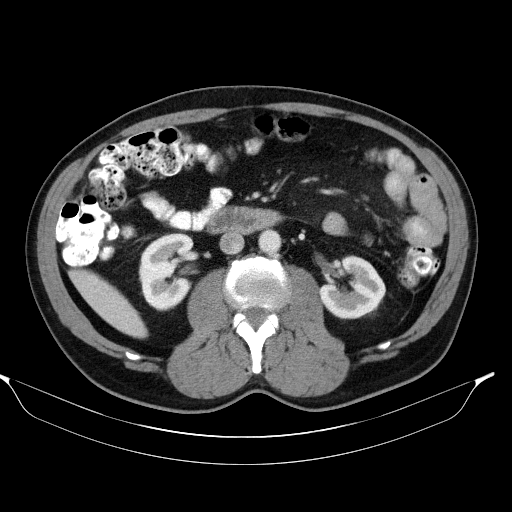
[im 74/136  mediastinal]
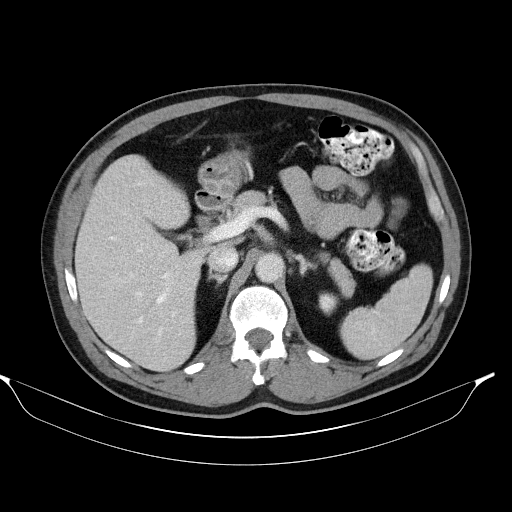
[im 86/136  mediastinal]
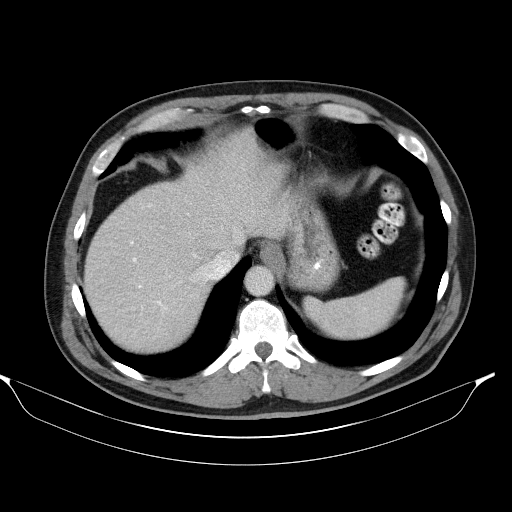
[im 99/136  mediastinal]
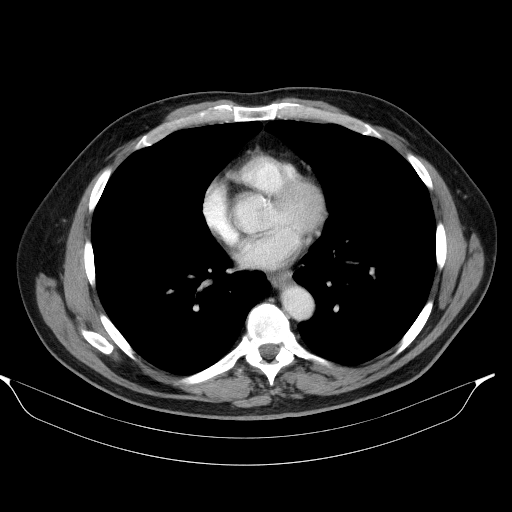
[im 123/136  mediastinal]
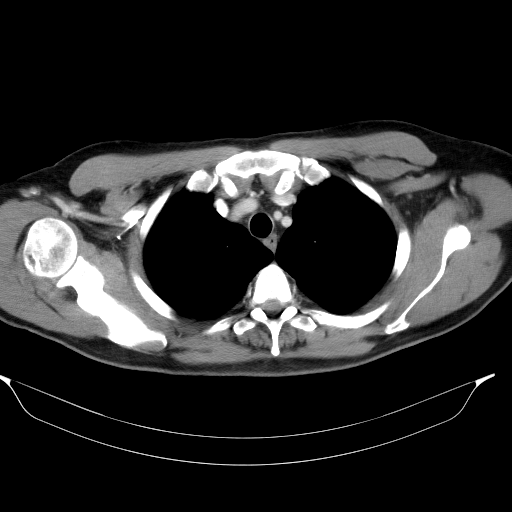

[Series 3: renal delay · axial · delayed · 0.85mm/px · z∈[-362,-347]mm · 2 of 38 slices shown, 5 images]
[im 19/38  mediastinal]
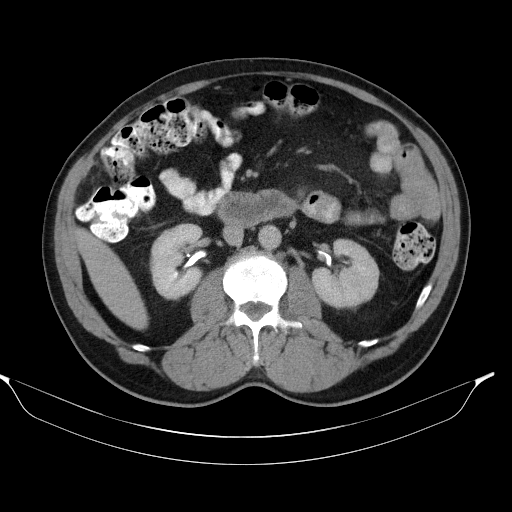
[im 19/38  lung]
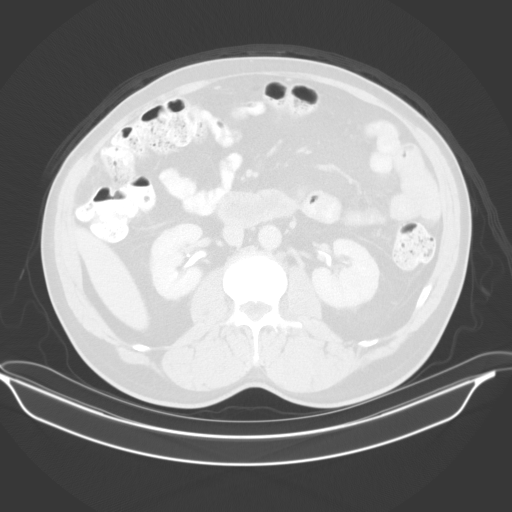
[im 19/38  bone]
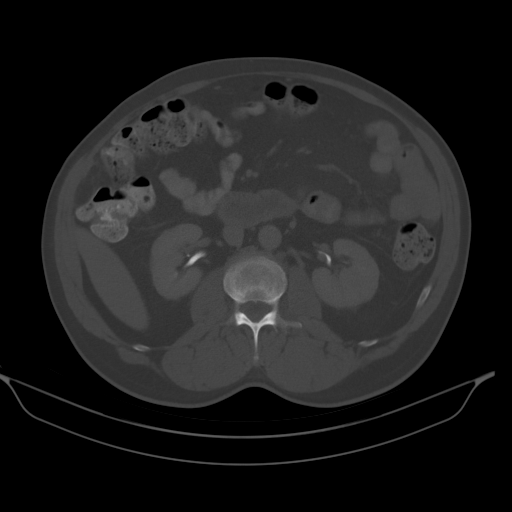
[im 22/38  mediastinal]
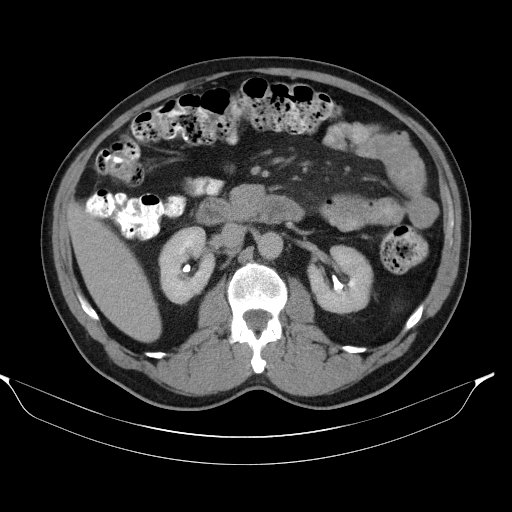
[im 22/38  lung]
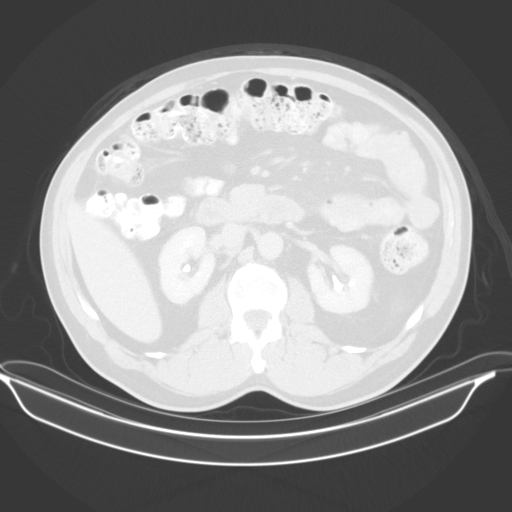

[Series 7: lung · axial · 0.76mm/px · z∈[-270,-172]mm · 4 of 148 slices shown]
[im 13/148  bone]
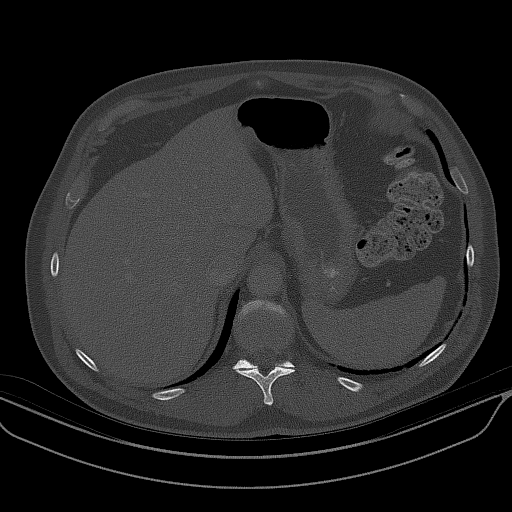
[im 37/148  bone]
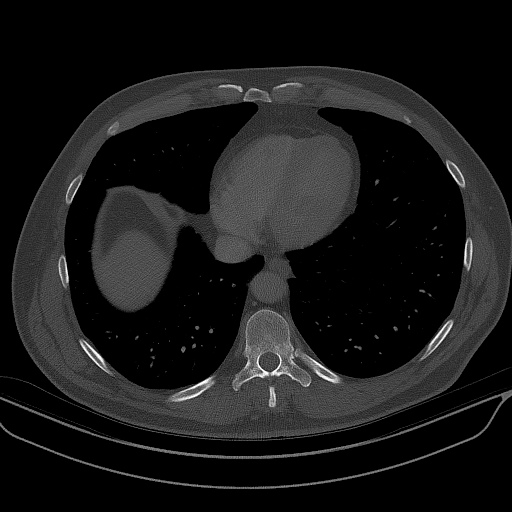
[im 50/148  bone]
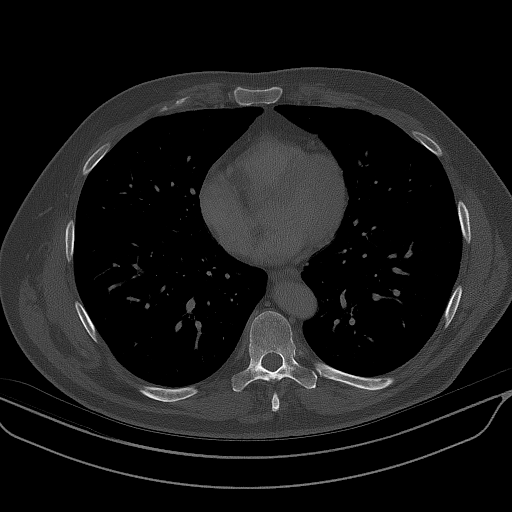
[im 62/148  bone]
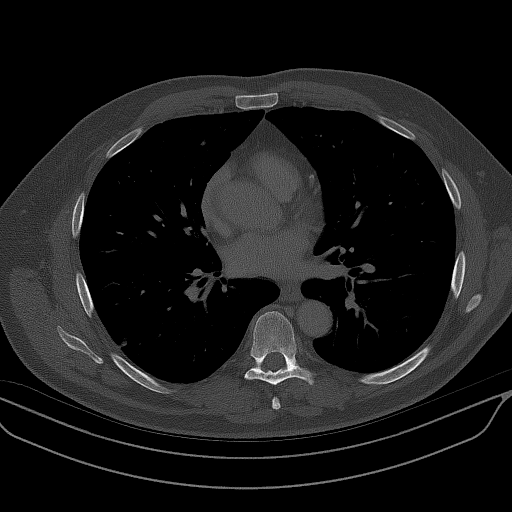

[14 of 30 positions shown; findings below may reference images not displayed]

FINDINGS: Creatinine was obtained on site at [HOSPITAL] at [HOSPITAL].

Results: Creatinine 1.1 mg/dL.

CT CHEST FINDINGS

Cardiovascular: The heart size is normal. No substantial pericardial
effusion. Coronary artery calcification is evident. Atherosclerotic
calcification is noted in the wall of the thoracic aorta.

Mediastinum/Nodes: 2.1 cm nodule identified left thyroid lobe
similar to 05/17/2017 CT. No mediastinal lymphadenopathy. There is
no hilar lymphadenopathy. The esophagus has normal imaging features.
There is no axillary lymphadenopathy.

Lungs/Pleura: The central tracheobronchial airways are patent. 6 mm
right lower lobe pulmonary nodule is stable. 4 mm left lower lobe
nodule (100/7) is also unchanged since 05/17/2017. No new or
progressive pulmonary nodule or mass. No pleural effusion.

Musculoskeletal: No worrisome lytic or sclerotic osseous
abnormality.

CT ABDOMEN PELVIS FINDINGS

Hepatobiliary: 6 mm hypodensity in the central right liver is
stable. There is no evidence for gallstones, gallbladder wall
thickening, or pericholecystic fluid. No intrahepatic or
extrahepatic biliary dilation.

Pancreas: No focal mass lesion. No dilatation of the main duct. No
intraparenchymal cyst. No peripancreatic edema.

Spleen: No splenomegaly. No focal mass lesion.

Adrenals/Urinary Tract: No adrenal nodule or mass. Right kidney
unremarkable. Lower pole ablation defect in the left kidney measures
14 x 18 mm without suspicious features. No evidence for hydroureter.
Similar appearance of asymmetric left-sided bladder wall thickening.

Stomach/Bowel: Stomach is nondistended. No gastric wall thickening.
No evidence of outlet obstruction. Duodenum is normally positioned
as is the ligament of Treitz. No small bowel wall thickening. No
small bowel dilatation. The terminal ileum is normal. The appendix
is normal. No gross colonic mass. No colonic wall thickening. No
substantial diverticular change. Stable appearance of the distal
rectal staple line.

Vascular/Lymphatic: There is abdominal aortic atherosclerosis
without aneurysm. 8 mm short axis left para-aortic lymph node seen
just medial to the left adrenal gland is unchanged since 05/17/2017.
There is no gastrohepatic or hepatoduodenal ligament
lymphadenopathy. No intraperitoneal or retroperitoneal
lymphadenopathy. No pelvic sidewall lymphadenopathy. Small pelvic
sidewall lymph nodes bilaterally are stable.

Reproductive: The prostate gland and seminal vesicles have normal
imaging features.

Other: No intraperitoneal free fluid.

Musculoskeletal: Presacral soft tissue is similar to prior. No
worrisome lytic or sclerotic osseous abnormality.
IMPRESSION: 1. Ablation defect lower pole left kidney without features to
suggest local recurrence. There is a small left para-aortic lymph
node, stable since 05/17/2017, but continued attention on follow-up
recommended.
2. Postsurgical changes in the presacral space with distal rectal
anastomosis. No substantial interval change.
3. No findings to suggest metastatic disease in the chest, abdomen,
or pelvis.
4. Stable tiny nodules in both lower lobes. Continued attention on
follow-up recommended

## 2018-04-16 MED ORDER — IOPAMIDOL (ISOVUE-300) INJECTION 61%
100.0000 mL | Freq: Once | INTRAVENOUS | Status: AC | PRN
  Administered 2018-04-16: 100 mL via INTRAVENOUS

## 2018-04-17 ENCOUNTER — Encounter: Payer: Self-pay | Admitting: *Deleted

## 2018-04-17 ENCOUNTER — Ambulatory Visit
Admission: RE | Admit: 2018-04-17 | Discharge: 2018-04-17 | Disposition: A | Discharge status: Home / Self Care | Payer: Medicare Other | Source: Ambulatory Visit | Attending: Interventional Radiology | Admitting: Interventional Radiology

## 2018-04-17 ENCOUNTER — Encounter: Payer: Self-pay | Admitting: Radiology

## 2018-04-17 DIAGNOSIS — N2889 Other specified disorders of kidney and ureter: Secondary | ICD-10-CM

## 2018-04-17 HISTORY — PX: IR RADIOLOGIST EVAL & MGMT: IMG5224

## 2018-04-17 NOTE — Progress Notes (Signed)
Patient ID: Jonathon Oneill, male   DOB: 10-29-1945, 72 y.o.   MRN: [id number]       Chief Complaint: 2-month status post left renal neoplasm cryoablation   Referring Physician(s): Eskew  History of Present Illness: Jonathon Oneill is a 72 y.o. male who is now 79-month status post cryoablation of a solid enhancing 13 mm left renal neoplasm/carcinoma.  He continues to recover well.  No current abdominal pain or flank pain.  No hematuria dysuria.  He has a prior history of colorectal cancer, status post radiation, surgery and chemotherapy.  Overall he continues to do well.  Stable weight and functional status.  No significant interval change.  No fevers.  He is here today to review surveillance imaging.  Past Medical History:  Diagnosis Date  . Cancer ([place]) 04/2017   rectal cancer  . Chronic kidney disease 04/2017   left renal mass  . Obesity   . Pseudophakia of right eye 11/2012  . Pulmonary nodules 04/2017  . Thyroid nodule 04/2017   multiple    Past Surgical History:  Procedure Laterality Date  . 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 IR RADIOLOGIST EVAL & MGMT  06/15/2017  . IR RADIOLOGIST EVAL & MGMT  11/28/2017  . IR RADIOLOGIST EVAL & MGMT  01/10/2018  . IR RADIOLOGIST EVAL & MGMT  04/17/2018    Allergies: Influenza vaccines and Ciprofloxacin  Medications: Prior to Admission medications   Medication Sig Start Date End Date Taking? Authorizing Provider  Cholecalciferol (VITAMIN D3) 2000 units capsule Take by mouth 3 x daily with food.   Yes [provider]  latanoprost (XALATAN) 0.005 % ophthalmic solution Place 1 drop into both eyes nightly. 03/21/16  Yes [provider]     No family history on file.  Social History   Socioeconomic History  . Marital status: Unknown    Spouse name: Not on file  . Number of children: Not on file  . Years of education: Not on file  .  Highest education level: Not on file  Occupational History  . Not on file  Social Needs  . Financial resource strain: Not on file  . Food insecurity:    Worry: Not on file    Inability: Not on file  . Transportation needs:    Medical: Not on file    Non-medical: Not on file  Tobacco Use  . Smoking status: Not on file  Substance and Sexual Activity  . Alcohol use: Not on file  . Drug use: Not on file  . Sexual activity: Not on file  Lifestyle  . Physical activity:    Days per week: Not on file    Minutes per session: Not on file  . Stress: Not on file  Relationships  . Social connections:    Talks on phone: Not on file    Gets together: Not on file    Attends religious service: Not on file    Active member of club or organization: Not on file    Attends meetings of clubs or organizations: Not on file    Relationship status: Not on file  Other Topics Concern  . Not on file  Social History Narrative  . Not on file    ECOG Status: 1 - Symptomatic but completely ambulatory  Review of Systems: A 12 point ROS discussed and pertinent positives are indicated in the HPI above.  All other systems are negative.  Review of Systems  Vital Signs: BP 122/70   Pulse 74   Temp 97.9 F (36.6 C) (Oral)   Resp 16   Ht 5\' 8"  (1.727 m)   Wt 84.8 kg   SpO2 99%   BMI 28.43 kg/m   Physical Exam  Constitutional: He is oriented to person, place, and time. He appears well-developed and well-nourished. No distress.  Eyes: Conjunctivae are normal. No scleral icterus.  Cardiovascular: Normal rate and regular rhythm.  Pulmonary/Chest: Effort normal and breath sounds normal.  Abdominal: Soft. Bowel sounds are normal.  Musculoskeletal: Normal range of motion. He exhibits no edema.  Neurological: He is alert and oriented to person, place, and time.  Skin: He is not diaphoretic.     Imaging: Ct Chest W Contrast  Addendum Date: 04/16/2018   ADDENDUM REPORT: 04/16/2018 12:56 ADDENDUM:  As noted in the body of the report, there is stable asymmetric wall thickening in the left bladder wall, indeterminate. Urology consultation may be warranted. Electronically Signed   By: Misty Stanley M.D.   On: 04/16/2018 12:56   Result Date: 04/16/2018 CLINICAL DATA:  Rectal and renal carcinoma. Status post cryo ablation of the left renal lesion. EXAM: CT CHEST, ABDOMEN, AND PELVIS WITH CONTRAST TECHNIQUE: Multidetector CT imaging of the chest, abdomen and pelvis was performed following the standard protocol during bolus administration of intravenous contrast. CONTRAST:  167mL ISOVUE-300 IOPAMIDOL (ISOVUE-300) INJECTION 61% COMPARISON:  09/14/2017.  05/17/2017 FINDINGS: Creatinine was obtained on site at [facility] at [address] Results: Creatinine 1.1 mg/dL. CT CHEST FINDINGS Cardiovascular: The heart size is normal. No substantial pericardial effusion. Coronary artery calcification is evident. Atherosclerotic calcification is noted in the wall of the thoracic aorta. Mediastinum/Nodes: 2.1 cm nodule identified left thyroid lobe similar to 05/17/2017 CT. No mediastinal lymphadenopathy. There is no hilar lymphadenopathy. The esophagus has normal imaging features. There is no axillary lymphadenopathy. Lungs/Pleura: The central tracheobronchial airways are patent. 6 mm right lower lobe pulmonary nodule is stable. 4 mm left lower lobe nodule (100/7) is also unchanged since 05/17/2017. No new or progressive pulmonary nodule or mass. No pleural effusion. Musculoskeletal: No worrisome lytic or sclerotic osseous abnormality. CT ABDOMEN PELVIS FINDINGS Hepatobiliary: 6 mm hypodensity in the central right liver is stable. There is no evidence for gallstones, gallbladder wall thickening, or pericholecystic fluid. No intrahepatic or extrahepatic biliary dilation. Pancreas: No focal mass lesion. No dilatation of the main duct. No intraparenchymal cyst. No peripancreatic edema. Spleen: No splenomegaly. No  focal mass lesion. Adrenals/Urinary Tract: No adrenal nodule or mass. Right kidney unremarkable. Lower pole ablation defect in the left kidney measures 14 x 18 mm without suspicious features. No evidence for hydroureter. Similar appearance of asymmetric left-sided bladder wall thickening. Stomach/Bowel: Stomach is nondistended. No gastric wall thickening. No evidence of outlet obstruction. Duodenum is normally positioned as is the ligament of Treitz. No small bowel wall thickening. No small bowel dilatation. The terminal ileum is normal. The appendix is normal. No gross colonic mass. No colonic wall thickening. No substantial diverticular change. Stable appearance of the distal rectal staple line. Vascular/Lymphatic: There is abdominal aortic atherosclerosis without aneurysm. 8 mm short axis left para-aortic lymph node seen just medial to the left adrenal gland is unchanged since 05/17/2017. There is no gastrohepatic or hepatoduodenal ligament lymphadenopathy. No intraperitoneal or retroperitoneal lymphadenopathy. No pelvic sidewall lymphadenopathy. Small pelvic sidewall lymph nodes bilaterally are stable. Reproductive: The prostate gland and seminal vesicles have normal imaging features.  Other: No intraperitoneal free fluid. Musculoskeletal: Presacral soft tissue is similar to prior. No worrisome lytic or sclerotic osseous abnormality. IMPRESSION: 1. Ablation defect lower pole left kidney without features to suggest local recurrence. There is a small left para-aortic lymph node, stable since 05/17/2017, but continued attention on follow-up recommended. 2. Postsurgical changes in the presacral space with distal rectal anastomosis. No substantial interval change. 3. No findings to suggest metastatic disease in the chest, abdomen, or pelvis. 4. Stable tiny nodules in both lower lobes. Continued attention on follow-up recommended Electronically Signed: By: Misty Stanley M.D. On: 04/16/2018 12:29   Ct Abdomen Pelvis W  Contrast  Addendum Date: 04/16/2018   ADDENDUM REPORT: 04/16/2018 12:56 ADDENDUM: As noted in the body of the report, there is stable asymmetric wall thickening in the left bladder wall, indeterminate. Urology consultation may be warranted. Electronically Signed   By: Misty Stanley M.D.   On: 04/16/2018 12:56   Result Date: 04/16/2018 CLINICAL DATA:  Rectal and renal carcinoma. Status post cryo ablation of the left renal lesion. EXAM: CT CHEST, ABDOMEN, AND PELVIS WITH CONTRAST TECHNIQUE: Multidetector CT imaging of the chest, abdomen and pelvis was performed following the standard protocol during bolus administration of intravenous contrast. CONTRAST:  166mL ISOVUE-300 IOPAMIDOL (ISOVUE-300) INJECTION 61% COMPARISON:  09/14/2017.  05/17/2017 FINDINGS: Creatinine was obtained on site at [place] at [address] Results: Creatinine 1.1 mg/dL. CT CHEST FINDINGS Cardiovascular: The heart size is normal. No substantial pericardial effusion. Coronary artery calcification is evident. Atherosclerotic calcification is noted in the wall of the thoracic aorta. Mediastinum/Nodes: 2.1 cm nodule identified left thyroid lobe similar to 05/17/2017 CT. No mediastinal lymphadenopathy. There is no hilar lymphadenopathy. The esophagus has normal imaging features. There is no axillary lymphadenopathy. Lungs/Pleura: The central tracheobronchial airways are patent. 6 mm right lower lobe pulmonary nodule is stable. 4 mm left lower lobe nodule (100/7) is also unchanged since 05/17/2017. No new or progressive pulmonary nodule or mass. No pleural effusion. Musculoskeletal: No worrisome lytic or sclerotic osseous abnormality. CT ABDOMEN PELVIS FINDINGS Hepatobiliary: 6 mm hypodensity in the central right liver is stable. There is no evidence for gallstones, gallbladder wall thickening, or pericholecystic fluid. No intrahepatic or extrahepatic biliary dilation. Pancreas: No focal mass lesion. No dilatation of the main  duct. No intraparenchymal cyst. No peripancreatic edema. Spleen: No splenomegaly. No focal mass lesion. Adrenals/Urinary Tract: No adrenal nodule or mass. Right kidney unremarkable. Lower pole ablation defect in the left kidney measures 14 x 18 mm without suspicious features. No evidence for hydroureter. Similar appearance of asymmetric left-sided bladder wall thickening. Stomach/Bowel: Stomach is nondistended. No gastric wall thickening. No evidence of outlet obstruction. Duodenum is normally positioned as is the ligament of Treitz. No small bowel wall thickening. No small bowel dilatation. The terminal ileum is normal. The appendix is normal. No gross colonic mass. No colonic wall thickening. No substantial diverticular change. Stable appearance of the distal rectal staple line. Vascular/Lymphatic: There is abdominal aortic atherosclerosis without aneurysm. 8 mm short axis left para-aortic lymph node seen just medial to the left adrenal gland is unchanged since 05/17/2017. There is no gastrohepatic or hepatoduodenal ligament lymphadenopathy. No intraperitoneal or retroperitoneal lymphadenopathy. No pelvic sidewall lymphadenopathy. Small pelvic sidewall lymph nodes bilaterally are stable. Reproductive: The prostate gland and seminal vesicles have normal imaging features. Other: No intraperitoneal free fluid. Musculoskeletal: Presacral soft tissue is similar to prior. No worrisome lytic or sclerotic osseous abnormality. IMPRESSION: 1. Ablation defect lower pole left kidney without features  to suggest local recurrence. There is a small left para-aortic lymph node, stable since 05/17/2017, but continued attention on follow-up recommended. 2. Postsurgical changes in the presacral space with distal rectal anastomosis. No substantial interval change. 3. No findings to suggest metastatic disease in the chest, abdomen, or pelvis. 4. Stable tiny nodules in both lower lobes. Continued attention on follow-up recommended  Electronically Signed: By: Misty Stanley M.D. On: 04/16/2018 12:29   Ir Radiologist Eval & Mgmt  Result Date: 04/17/2018 Please refer to notes tab for details about interventional procedure. (Op Note)   Labs:  CBC: No results for input(s): WBC, HGB, HCT, PLT in the last 8760 hours.  COAGS: No results for input(s): INR, APTT in the last 8760 hours.  BMP: No results for input(s): NA, K, CL, CO2, GLUCOSE, BUN, CALCIUM, CREATININE, GFRNONAA, GFRAA in the last 8760 hours.  Invalid input(s): CMP  LIVER FUNCTION TESTS: No results for input(s): BILITOT, AST, ALT, ALKPHOS, PROT, ALBUMIN in the last 8760 hours.   Assessment and Plan:  42-month status post successful CT-guided cryoablation of a small enhancing left renal mass concerning for a renal cell carcinoma/neoplasm.  Because of the small size of the lesion, biopsy was not performed which may have hindered treatment/needle placement, resulted in nondiagnostic findings, increased the risk of local tumor spread, and increased the risk of complication.  Lesion meets imaging criteria for a small renal neoplasm with solid enhancing characteristics.  Overall he is recovered very well.  No current flank abdominal pain.  No dysuria hematuria.  Stable functional status.  Plan: Repeat surveillance CT without and with contrast in 6 months.  Thank you for this interesting consult.  I greatly enjoyed meeting Jonathon Oneill and look forward to participating in their care.  A copy of this report was sent to the requesting provider on this date.  Electronically Signed: Greggory Keen 04/17/2018, 11:06 AM   I spent a total of  30 Minutes   in face to face in clinical consultation, greater than 50% of which was counseling/coordinating care for this patient with colorectal cancer and a small left renal neoplasm.

## 2018-07-30 ENCOUNTER — Ambulatory Visit: Payer: Medicare Other | Admitting: Gastroenterology

## 2018-08-01 ENCOUNTER — Ambulatory Visit: Payer: Medicare Other | Admitting: Gastroenterology

## 2018-08-01 ENCOUNTER — Encounter: Payer: Self-pay | Admitting: Gastroenterology

## 2018-08-01 VITALS — BP 124/80 | HR 91 | Ht 68.0 in | Wt 204.4 lb

## 2018-08-01 DIAGNOSIS — Z800 Family history of malignant neoplasm of digestive organs: Secondary | ICD-10-CM | POA: Diagnosis not present

## 2018-08-01 DIAGNOSIS — Z85048 Personal history of other malignant neoplasm of rectum, rectosigmoid junction, and anus: Secondary | ICD-10-CM | POA: Diagnosis not present

## 2018-08-01 MED ORDER — SUPREP BOWEL PREP KIT 17.5-3.13-1.6 GM/177ML PO SOLN: 1.0000 | ORAL | 0 refills | Status: DC

## 2018-08-01 NOTE — Progress Notes (Signed)
Chief Complaint: For colonoscopy  Referring Provider:  No ref. provider found      ASSESSMENT AND PLAN;   #1. 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 Neg CT 04/2018 for recurrence. Followed by Dr Pacholke/Dr Cruzita Lederer. Nl CEA level 01/2018. #2. FH colon cancer (mom at age 72). #3. H/O Polyps (TAs) on index colonoscopy 04/2017. #4. Left renal mass s/p cryoablation under CT guidance (Dr Reesa Chew).  Plan: - Proceed with colonoscopy.  I have discussed the risks and benefits.  The risks including risk of perforation requiring laparotomy, bleeding after polypectomy requiring blood transfusions and risks of anesthesia/sedation were discussed. Consent forms were given for review. -He will follow-up regularly for FU CTs and CEA level with Dr. Cruzita Lederer.    HPI:    Jonathon Oneill is a 73 y.o. male  No nausea, vomiting, heartburn, regurgitation, odynophagia or dysphagia.  No significant diarrhea or constipation.  There is no melena or hematochezia (had some, better with prep-H). No unintentional weight loss. Here to get repeat colonoscopy.  Daughter is a patient of ours as well. Past Medical History:  Diagnosis Date  . Cancer ([place]) 04/2017   rectal cancer  . Chronic kidney disease 04/2017   left renal mass  . Obesity   . Pseudophakia of right eye 11/2012  . Pulmonary nodules 04/2017  . Thyroid nodule 04/2017   multiple    Past Surgical History:  Procedure Laterality Date  . COLON SURGERY  08/2017  . COLONOSCOPY  04/2017  . 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 IR RADIOLOGIST EVAL & MGMT  06/15/2017  . IR RADIOLOGIST EVAL & MGMT  11/28/2017  . IR RADIOLOGIST EVAL & MGMT  01/10/2018  . IR RADIOLOGIST EVAL & MGMT  04/17/2018    Family History  Problem Relation Age of Onset  .  Colon cancer Mother   . Esophageal cancer Maternal Great-grandfather     Social History   Tobacco Use  . Smoking status: Never Smoker  . Smokeless tobacco: Never Used  Substance Use Topics  . Alcohol use: Not Currently  . Drug use: Never    Current Outpatient Medications  Medication Sig Dispense Refill  . Bilberry, Vaccinium myrtillus, (BILBERRY PO) Take 2 tablets by mouth daily.    . Cholecalciferol (VITAMIN D3) 2000 units capsule Take by mouth 3 x daily with food.    . latanoprost (XALATAN) 0.005 % ophthalmic solution Place 1 drop into both eyes nightly.    . TURMERIC PO Take 1 tablet by mouth daily.     No current facility-administered medications for this visit.     Allergies  Allergen Reactions  . Influenza Vaccines Nausea Only  . Ciprofloxacin Rash    Review of Systems:  Constitutional: Denies fever, chills, diaphoresis, appetite change and fatigue.  HEENT: Denies photophobia, eye pain, redness, hearing loss, ear pain, congestion, sore throat, rhinorrhea, sneezing, mouth sores, neck pain, neck stiffness and tinnitus.   Respiratory: Denies SOB, DOE, cough, chest tightness,  and wheezing.   Cardiovascular: Denies chest pain, palpitations and leg swelling.  Genitourinary: Denies dysuria, urgency, frequency, hematuria, flank pain and difficulty urinating.  Musculoskeletal: Denies myalgias, back pain, joint swelling, arthralgias and gait problem.  Skin: No rash.  Neurological: Denies dizziness, seizures, syncope, weakness, light-headedness, numbness and headaches.  Hematological: Denies adenopathy. Easy bruising, personal or family bleeding history  Psychiatric/Behavioral: No anxiety or depression     Physical Exam:    BP 124/80   Pulse 91   Ht 5\' 8"  (1.727 m)   Wt 204 lb 6 oz (92.7 kg)   BMI 31.08 kg/m  Filed Weights   08/01/18 0842  Weight: 204 lb 6 oz (92.7 kg)   Constitutional:  Well-developed, in no acute distress. Psychiatric: Normal mood and affect.  Behavior is normal. HEENT: Pupils normal.  Conjunctivae are normal. No scleral icterus. Neck supple.  Cardiovascular: Normal rate, regular rhythm. No edema Pulmonary/chest: Effort normal and breath sounds normal. No wheezing, rales or rhonchi. Abdominal: Soft, nondistended. Nontender. Bowel sounds active throughout. There are no masses palpable. No hepatomegaly.  Well-healed surgical scars.  Some divarication of recti.  No definite hernias. Rectal: To be performed at the time of colonoscopy. Neurological: Alert and oriented to person place and time. Skin: Skin is warm and dry. No rashes noted.   Carmell Austria, MD 08/01/2018, 9:01 AM  Cc: Dr. Cruzita Lederer

## 2018-08-01 NOTE — Patient Instructions (Signed)
If you are age 72 or older, your body mass index should be between 23-30. Your Body mass index is 31.08 kg/m. If this is out of the aforementioned range listed, please consider follow up with your Primary Care Provider.  If you are age 31 or younger, your body mass index should be between 19-25. Your Body mass index is 31.08 kg/m. If this is out of the aformentioned range listed, please consider follow up with your Primary Care Provider.   We have sent the following medications to your pharmacy for you to pick up at your convenience: Suprep  It has been recommended to you by your physician that you have a(n) Colonoscopy completed. We did not schedule the procedure(s) today. Please contact our office at [phone]  to have the procedure completed.  Thank you,  Dr. Jackquline Denmark

## 2018-08-06 ENCOUNTER — Encounter: Payer: Self-pay | Admitting: Gastroenterology

## 2018-08-24 ENCOUNTER — Other Ambulatory Visit: Payer: Self-pay

## 2018-08-24 ENCOUNTER — Encounter: Payer: Self-pay | Admitting: Gastroenterology

## 2018-08-24 ENCOUNTER — Ambulatory Visit (AMBULATORY_SURGERY_CENTER): Payer: Self-pay | Admitting: *Deleted

## 2018-08-24 VITALS — Ht 68.0 in | Wt 208.4 lb

## 2018-08-24 DIAGNOSIS — Z85048 Personal history of other malignant neoplasm of rectum, rectosigmoid junction, and anus: Secondary | ICD-10-CM

## 2018-08-24 NOTE — Progress Notes (Signed)
No egg or soy allergy known to patient  No issues with past sedation with any surgeries  or procedures, no intubation problems  No diet pills per patient No home 02 use per patient  No blood thinners per patient  Pt denies issues with constipation  No A fib or A flutter  EMMI video offered but declined by the patient

## 2018-09-07 ENCOUNTER — Other Ambulatory Visit: Payer: Self-pay

## 2018-09-07 ENCOUNTER — Ambulatory Visit (AMBULATORY_SURGERY_CENTER): Payer: Medicare Other | Admitting: Gastroenterology

## 2018-09-07 ENCOUNTER — Encounter: Payer: Self-pay | Admitting: Gastroenterology

## 2018-09-07 VITALS — BP 108/65 | HR 72 | Temp 97.5°F | Resp 15 | Ht 68.0 in | Wt 204.0 lb

## 2018-09-07 DIAGNOSIS — K626 Ulcer of anus and rectum: Secondary | ICD-10-CM

## 2018-09-07 DIAGNOSIS — K5289 Other specified noninfective gastroenteritis and colitis: Secondary | ICD-10-CM | POA: Diagnosis not present

## 2018-09-07 DIAGNOSIS — D122 Benign neoplasm of ascending colon: Secondary | ICD-10-CM | POA: Diagnosis not present

## 2018-09-07 DIAGNOSIS — Z85048 Personal history of other malignant neoplasm of rectum, rectosigmoid junction, and anus: Secondary | ICD-10-CM

## 2018-09-07 MED ORDER — SODIUM CHLORIDE 0.9 % IV SOLN: 500.0000 mL | Freq: Once | INTRAVENOUS | Status: DC

## 2018-09-07 NOTE — Progress Notes (Signed)
Called to room to assist during endoscopic procedure.  Patient ID and intended procedure confirmed with present staff. Received instructions for my participation in the procedure from the performing physician.  

## 2018-09-07 NOTE — Progress Notes (Signed)
Pt's states no medical or surgical changes since previsit or office visit. 

## 2018-09-07 NOTE — Op Note (Signed)
[place] Patient Name: Jonathon Oneill Procedure Date: 09/07/2018 8:50 AM MRN: [id number] Endoscopist: Jackquline Denmark , MD Age: 73 Referring MD:  Date of Birth: 1946/02/25 Gender: Male Account #: [id number] Procedure:                Colonoscopy Indications:              #1. H/O Rectal cancer (stage II) s/p neo-adjuvant                            chemo-XRT followed by LAR 08/2017 with diverting                            ileostomy, followed by takedown May 2019 at [facility]                            (Dr Christ Kick). Neg CT 04/2018 for recurrence.                            Followed by Dr Pacholke/Dr Cruzita Lederer. Nl CEA level                            01/2018.                           #2. FH colon cancer (mom at age 28).                           #3. H/O Polyps (TAs) on index colonoscopy 04/2017. Medicines:                Monitored Anesthesia Care Procedure:                Pre-Anesthesia Assessment:                           - Prior to the procedure, a History and Physical                            was performed, and patient medications and                            allergies were reviewed. The patient's tolerance of                            previous anesthesia was also reviewed. The risks                            and benefits of the procedure and the sedation                            options and risks were discussed with the patient.                            All questions were answered, and informed consent  was obtained. Prior Anticoagulants: The patient has                            taken no previous anticoagulant or antiplatelet                            agents. ASA Grade Assessment: II - A patient with                            mild systemic disease. After reviewing the risks                            and benefits, the patient was deemed in                            satisfactory condition to undergo the procedure.  After obtaining informed consent, the colonoscope                            was passed under direct vision. Throughout the                            procedure, the patient's blood pressure, pulse, and                            oxygen saturations were monitored continuously. The                            Colonoscope was introduced through the anus and                            advanced to the the cecum, identified by                            appendiceal orifice and ileocecal valve. The                            colonoscopy was performed without difficulty. The                            patient tolerated the procedure well. The quality                            of the bowel preparation was good. The ileocecal                            valve, appendiceal orifice, and rectum were                            photographed. Scope In: 8:54:21 AM Scope Out: 9:09:17 AM Scope Withdrawal Time: 0 hours 11 minutes 20 seconds  Total Procedure Duration: 0 hours 14 minutes 56 seconds  Findings:                 Two sessile polyps were found in  the ascending                            colon. The polyps were 4 to 6 mm in size. These                            polyps were removed with a cold snare. Resection                            and retrieval were complete. Estimated blood loss:                            none.                           A few small-mouthed diverticula were found in the                            sigmoid colon, descending colon and ascending colon.                           There was evidence of a prior end-to-end very low                            low-anterior anastomosis in the distal rectum                            almost at the dentate line. This was patent and was                            characterized by erythema and likely granulomas.                            The anastomosis was traversed. Biopsies were taken                            with a cold forceps for  histology. Estimated blood                            loss was minimal. Retroflexed examination of the                            rectum was intentionally not performed.                           The exam was otherwise without abnormality. Complications:            No immediate complications. Estimated Blood Loss:     Estimated blood loss: none. Impression:               -Small colonic polyps status post polypectomy.                           -Mild pancolonic diverticulosis.                           -  Very low LAR with likely suture granulomas                            (biopsied , doubt recurrence). Recommendation:           - Patient has a contact number available for                            emergencies. The signs and symptoms of potential                            delayed complications were discussed with the                            patient. Return to normal activities tomorrow.                            Written discharge instructions were provided to the                            patient.                           - Resume previous diet.                           - Continue present medications.                           - Await pathology results.                           - Repeat colonoscopy for surveillance based on                            pathology results.                           - Return to GI clinic PRN. Jackquline Denmark, MD 09/07/2018 9:19:20 AM This report has been signed electronically.

## 2018-09-07 NOTE — Patient Instructions (Signed)
YOU HAD AN ENDOSCOPIC PROCEDURE TODAY AT [facility]:   Refer to the procedure report that was given to you for any specific questions about what was found during the examination.  If the procedure report does not answer your questions, please call your gastroenterologist to clarify.  If you requested that your care partner not be given the details of your procedure findings, then the procedure report has been included in a sealed envelope for you to review at your convenience later.  YOU SHOULD EXPECT: Some feelings of bloating in the abdomen. Passage of more gas than usual.  Walking can help get rid of the air that was put into your GI tract during the procedure and reduce the bloating. If you had a lower endoscopy (such as a colonoscopy or flexible sigmoidoscopy) you may notice spotting of blood in your stool or on the toilet paper. If you underwent a bowel prep for your procedure, you may not have a normal bowel movement for a few days.  Please Note:  You might notice some irritation and congestion in your nose or some drainage.  This is from the oxygen used during your procedure.  There is no need for concern and it should clear up in a day or so.  SYMPTOMS TO REPORT IMMEDIATELY:   Following lower endoscopy (colonoscopy or flexible sigmoidoscopy):  Excessive amounts of blood in the stool  Significant tenderness or worsening of abdominal pains  Swelling of the abdomen that is new, acute  Fever of 100F or higher   For urgent or emergent issues, a gastroenterologist can be reached at any hour by calling [phone].   DIET:  We do recommend a small meal at first, but then you may proceed to your regular diet.  Drink plenty of fluids but you should avoid alcoholic beverages for 24 hours.  ACTIVITY:  You should plan to take it easy for the rest of today and you should NOT DRIVE or use heavy machinery until tomorrow (because of the sedation medicines used during the test).     FOLLOW UP: Our staff will call the number listed on your records the next business day following your procedure to check on you and address any questions or concerns that you may have regarding the information given to you following your procedure. If we do not reach you, we will leave a message.  However, if you are feeling well and you are not experiencing any problems, there is no need to return our call.  We will assume that you have returned to your regular daily activities without incident.  If any biopsies were taken you will be contacted by phone or by letter within the next 1-3 weeks.  Please call us at [phone] if you have not heard about the biopsies in 3 weeks.    SIGNATURES/CONFIDENTIALITY: You and/or your care partner have signed paperwork which will be entered into your electronic medical record.  These signatures attest to the fact that that the information above on your After Visit Summary has been reviewed and is understood.  Full responsibility of the confidentiality of this discharge information lies with you and/or your care-partner.  Polyp, diverticulosis information given.

## 2018-09-07 NOTE — Progress Notes (Signed)
A/ox3, pleased with MAC, report to RN 

## 2018-09-10 ENCOUNTER — Telehealth: Payer: Self-pay

## 2018-09-10 NOTE — Telephone Encounter (Signed)
  Follow up Call-  Call back number 09/07/2018  Post procedure Call Back phone  # [phone]  Permission to leave phone message Yes  Some recent data might be hidden     Patient questions:  Do you have a fever, pain , or abdominal swelling? No. Pain Score  0 *  Have you tolerated food without any problems? Yes.    Have you been able to return to your normal activities? Yes.    Do you have any questions about your discharge instructions: Diet   No. Medications  No. Follow up visit  No.  Do you have questions or concerns about your Care? Yes.   WANTS DR GUPTA TO CALL HIM.  DID NOT WANT TO DISCUSS WITH RN. Actions: * If pain score is 4 or above: No action needed, pain <4.

## 2018-09-11 ENCOUNTER — Telehealth: Payer: Self-pay | Admitting: Gastroenterology

## 2018-09-11 NOTE — Telephone Encounter (Signed)
Spoke with pt he stated that he had a question about his colon that was done on 09/07/2018 pt did not advised of the question he stated he would like to discuss with dr.gupta.

## 2018-09-11 NOTE — Telephone Encounter (Signed)
I returned call to the patient.  He has questions he would like to only ask of Dr. Lyndel Safe.  Dr. Lyndel Safe please call patient at your convenience

## 2018-09-13 ENCOUNTER — Encounter: Payer: Self-pay | Admitting: Gastroenterology

## 2018-09-13 NOTE — Telephone Encounter (Signed)
Have called him several times at [phone]. No answer Left a message on his answering machine as well  Have sent him a letter about biopsy results.

## 2018-12-19 ENCOUNTER — Other Ambulatory Visit: Payer: Self-pay | Admitting: Interventional Radiology

## 2018-12-19 DIAGNOSIS — N2889 Other specified disorders of kidney and ureter: Secondary | ICD-10-CM

## 2019-03-25 DIAGNOSIS — R2242 Localized swelling, mass and lump, left lower limb: Secondary | ICD-10-CM

## 2019-03-25 HISTORY — DX: Localized swelling, mass and lump, left lower limb: R22.42

## 2019-07-20 LAB — PROTIME-INR: INR: 0.9 (ref 0.9–1.1)

## 2019-07-23 ENCOUNTER — Encounter: Payer: Self-pay | Admitting: Cardiology

## 2019-07-23 ENCOUNTER — Inpatient Hospital Stay (HOSPITAL_COMMUNITY)
Admission: AD | Admit: 2019-07-23 | Discharge: 2019-07-25 | DRG: 247 | Disposition: A | Discharge status: Home / Self Care | Payer: Medicare PPO | Attending: Cardiology | Admitting: Cardiology

## 2019-07-23 ENCOUNTER — Other Ambulatory Visit: Payer: Self-pay

## 2019-07-23 ENCOUNTER — Encounter (HOSPITAL_COMMUNITY): Payer: Self-pay | Admitting: Cardiology

## 2019-07-23 ENCOUNTER — Ambulatory Visit: Payer: Medicare Other | Admitting: Cardiology

## 2019-07-23 ENCOUNTER — Ambulatory Visit (INDEPENDENT_AMBULATORY_CARE_PROVIDER_SITE_OTHER): Payer: Medicare PPO | Admitting: Cardiology

## 2019-07-23 VITALS — BP 110/82 | HR 111 | Ht 68.0 in | Wt 204.0 lb

## 2019-07-23 DIAGNOSIS — I213 ST elevation (STEMI) myocardial infarction of unspecified site: Secondary | ICD-10-CM | POA: Diagnosis present

## 2019-07-23 DIAGNOSIS — Z85038 Personal history of other malignant neoplasm of large intestine: Secondary | ICD-10-CM | POA: Diagnosis not present

## 2019-07-23 DIAGNOSIS — E785 Hyperlipidemia, unspecified: Secondary | ICD-10-CM | POA: Diagnosis not present

## 2019-07-23 DIAGNOSIS — I5022 Chronic systolic (congestive) heart failure: Secondary | ICD-10-CM | POA: Diagnosis not present

## 2019-07-23 DIAGNOSIS — E669 Obesity, unspecified: Secondary | ICD-10-CM | POA: Diagnosis not present

## 2019-07-23 DIAGNOSIS — Z01812 Encounter for preprocedural laboratory examination: Secondary | ICD-10-CM | POA: Diagnosis not present

## 2019-07-23 DIAGNOSIS — I214 Non-ST elevation (NSTEMI) myocardial infarction: Principal | ICD-10-CM

## 2019-07-23 DIAGNOSIS — Z955 Presence of coronary angioplasty implant and graft: Secondary | ICD-10-CM

## 2019-07-23 DIAGNOSIS — N189 Chronic kidney disease, unspecified: Secondary | ICD-10-CM | POA: Diagnosis present

## 2019-07-23 DIAGNOSIS — I2510 Atherosclerotic heart disease of native coronary artery without angina pectoris: Secondary | ICD-10-CM

## 2019-07-23 DIAGNOSIS — Z6830 Body mass index (BMI) 30.0-30.9, adult: Secondary | ICD-10-CM | POA: Diagnosis not present

## 2019-07-23 DIAGNOSIS — R079 Chest pain, unspecified: Secondary | ICD-10-CM | POA: Diagnosis not present

## 2019-07-23 DIAGNOSIS — N1831 Chronic kidney disease, stage 3a: Secondary | ICD-10-CM | POA: Diagnosis not present

## 2019-07-23 DIAGNOSIS — R9431 Abnormal electrocardiogram [ECG] [EKG]: Secondary | ICD-10-CM | POA: Diagnosis not present

## 2019-07-23 DIAGNOSIS — I2102 ST elevation (STEMI) myocardial infarction involving left anterior descending coronary artery: Secondary | ICD-10-CM | POA: Diagnosis not present

## 2019-07-23 DIAGNOSIS — Z20822 Contact with and (suspected) exposure to covid-19: Secondary | ICD-10-CM | POA: Diagnosis not present

## 2019-07-23 DIAGNOSIS — I255 Ischemic cardiomyopathy: Secondary | ICD-10-CM | POA: Diagnosis not present

## 2019-07-23 DIAGNOSIS — I361 Nonrheumatic tricuspid (valve) insufficiency: Secondary | ICD-10-CM

## 2019-07-23 HISTORY — DX: Chronic systolic (congestive) heart failure: I50.22

## 2019-07-23 HISTORY — DX: Ischemic cardiomyopathy: I25.5

## 2019-07-23 HISTORY — DX: Non-ST elevation (NSTEMI) myocardial infarction: I21.4

## 2019-07-23 LAB — HEMOGLOBIN A1C
Hgb A1c MFr Bld: 5.7 % — ABNORMAL HIGH (ref 4.8–5.6)
Mean Plasma Glucose: 116.89 mg/dL

## 2019-07-23 LAB — TSH: TSH: 8.277 u[IU]/mL — ABNORMAL HIGH (ref 0.350–4.500)

## 2019-07-23 MED ORDER — HEPARIN (PORCINE) 25000 UT/250ML-% IV SOLN
1600.0000 [IU]/h | INTRAVENOUS | Status: DC
  Administered 2019-07-23: 1200 [IU]/h via INTRAVENOUS
  Administered 2019-07-24: 1600 [IU]/h via INTRAVENOUS
  Filled 2019-07-23 (×2): qty 250

## 2019-07-23 MED ORDER — LATANOPROST 0.005 % OP SOLN
1.0000 [drp] | Freq: Every day | OPHTHALMIC | Status: DC
  Administered 2019-07-23 – 2019-07-24 (×2): 1 [drp] via OPHTHALMIC
  Filled 2019-07-23: qty 2.5

## 2019-07-23 MED ORDER — ASPIRIN EC 81 MG PO TBEC: 81.0000 mg | DELAYED_RELEASE_TABLET | Freq: Every day | ORAL | 3 refills | Status: AC

## 2019-07-23 MED ORDER — ACETAMINOPHEN 325 MG PO TABS: 650.0000 mg | ORAL_TABLET | ORAL | Status: DC | PRN

## 2019-07-23 MED ORDER — LOPERAMIDE HCL 2 MG PO CAPS
2.0000 mg | ORAL_CAPSULE | ORAL | Status: DC | PRN
  Administered 2019-07-23 – 2019-07-24 (×3): 2 mg via ORAL
  Filled 2019-07-23 (×3): qty 1

## 2019-07-23 MED ORDER — ASPIRIN 81 MG PO CHEW
324.0000 mg | CHEWABLE_TABLET | ORAL | Status: AC
  Administered 2019-07-23: 324 mg via ORAL
  Filled 2019-07-23: qty 4

## 2019-07-23 MED ORDER — ASPIRIN EC 81 MG PO TBEC
81.0000 mg | DELAYED_RELEASE_TABLET | Freq: Every day | ORAL | Status: DC
  Administered 2019-07-24: 81 mg via ORAL
  Filled 2019-07-23 (×2): qty 1

## 2019-07-23 MED ORDER — ROSUVASTATIN CALCIUM 10 MG PO TABS: 10.0000 mg | ORAL_TABLET | Freq: Every day | ORAL | 1 refills | Status: DC

## 2019-07-23 MED ORDER — NITROGLYCERIN 0.4 MG SL SUBL: 0.4000 mg | SUBLINGUAL_TABLET | SUBLINGUAL | Status: DC | PRN

## 2019-07-23 MED ORDER — ZOLPIDEM TARTRATE 5 MG PO TABS: 5.0000 mg | ORAL_TABLET | Freq: Every evening | ORAL | Status: DC | PRN

## 2019-07-23 MED ORDER — ALPRAZOLAM 0.25 MG PO TABS: 0.2500 mg | ORAL_TABLET | Freq: Two times a day (BID) | ORAL | Status: DC | PRN

## 2019-07-23 MED ORDER — METOPROLOL SUCCINATE ER 25 MG PO TB24
12.5000 mg | ORAL_TABLET | Freq: Every day | ORAL | Status: DC
  Administered 2019-07-23 – 2019-07-24 (×2): 12.5 mg via ORAL
  Filled 2019-07-23 (×2): qty 1

## 2019-07-23 MED ORDER — HEPARIN BOLUS VIA INFUSION
4000.0000 [IU] | Freq: Once | INTRAVENOUS | Status: AC
  Administered 2019-07-23: 4000 [IU] via INTRAVENOUS
  Filled 2019-07-23: qty 4000

## 2019-07-23 MED ORDER — METOPROLOL SUCCINATE ER 25 MG PO TB24: 12.5000 mg | ORAL_TABLET | Freq: Every day | ORAL | 1 refills | Status: DC

## 2019-07-23 MED ORDER — ROSUVASTATIN CALCIUM 5 MG PO TABS
10.0000 mg | ORAL_TABLET | Freq: Every day | ORAL | Status: DC
  Administered 2019-07-23: 10 mg via ORAL
  Filled 2019-07-23 (×2): qty 2

## 2019-07-23 MED ORDER — ONDANSETRON HCL 4 MG/2ML IJ SOLN: 4.0000 mg | Freq: Four times a day (QID) | INTRAMUSCULAR | Status: DC | PRN

## 2019-07-23 MED ORDER — ASPIRIN 300 MG RE SUPP: 300.0000 mg | RECTAL | Status: AC

## 2019-07-23 NOTE — Progress Notes (Signed)
Patient seen and examined.  He is 74 year old male with history of colorectal cancer status post surgery, CKD, obesity who presents for evaluation of MI. He reports that he had an episode of chest pain on the morning of 07/20/2019. Lasted for a couple of hours. States that he went to the [place] ED, and was told he had an MI. However he was feeling improved so he left AMA. He reports no chest pain since that time. He saw Dr. Harriet Masson in clinic today and noted to have Q waves in V1-3, with ST elevations in V1-5, I. Dr. 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 Patient was sent to [facility] for an echocardiogram. Also had labs drawn, which showed troponin I 17.5, creatinine 1.3. We do not have the echo report from [place]. Patient currently denies any chest pain. Reports he has been asymptomatic since his episode of chest pain on 1/16.  For his late presentation of MI, will continue ASA, statin, metoprolol.  Will start heparin gtt.  Plan for cath tomorrow.  Will need to obtain echo report from [place] vs repeat echo tomorrow.    Risks and benefits of cardiac catheterization have been discussed with the patient.  These include bleeding, infection, kidney damage, stroke, heart attack, death.  The patient understands these risks and is willing to proceed.  Donato Heinz, MD

## 2019-07-23 NOTE — Patient Instructions (Signed)
Medication Instructions:  Your physician has recommended you make the following change in your medication:   START: Aspirin 81 mg Take 1 tab  Daily START: Crestor (rosuvastatin) 10 mg Take 1 tab daily START: Toprol XL ( metoprolol succinate ) 25 mg Take 1/2 tab daily  *If you need a refill on your cardiac medications before your next appointment, please call your pharmacy*  Lab Work: Your physician recommends that you return for lab work in: [place] Troponin,BMP,MAgnesium  If you have labs (blood work) drawn today and your tests are completely normal, you will receive your results only by: Marland Kitchen MyChart Message (if you have MyChart) OR . A paper copy in the mail If you have any lab test that is abnormal or we need to change your treatment, we will call you to review the results.  Testing/Procedures: Your physician has requested that you have an echocardiogram. Echocardiography is a painless test that uses sound waves to create images of your heart. It provides your doctor with information about the size and shape of your heart and how well your heart's chambers and valves are working. This procedure takes approximately one hour. There are no restrictions for this procedure.    Follow-Up: At [facility], you and your health needs are our priority.  As part of our continuing mission to provide you with exceptional heart care, we have created designated Provider Care Teams.  These Care Teams include your primary Cardiologist (physician) and Advanced Practice Providers (APPs -  Physician Assistants and Nurse Practitioners) who all work together to provide you with the care you need, when you need it.  Your next appointment:   4 week(s)  The format for your next appointment:   In Person  Provider:   Berniece Salines, DO  Other Instructions

## 2019-07-23 NOTE — Progress Notes (Signed)
ANTICOAGULATION CONSULT NOTE - Initial Consult  Pharmacy Consult for IV Heparin  Indication: chest pain/ACS  Allergies  Allergen Reactions  . Influenza Vaccines Nausea Only  . Ciprofloxacin Rash    Patient Measurements: Height: (P) 5\' 8"  (172.7 cm) Weight: (P) 194 lb 1.6 oz (88 kg) IBW/kg (Calculated) : (P) 68.4 Heparin Dosing Weight: 86.3 kg  Vital Signs: BP: 110/82 (01/19 1113) Pulse Rate: 111 (01/19 1113)  Labs: No results for input(s): HGB, HCT, PLT, APTT, LABPROT, INR, HEPARINUNFRC, HEPRLOWMOCWT, CREATININE, CKTOTAL, CKMB, TROPONINIHS in the last 72 hours.  CrCl cannot be calculated (No successful lab value found.).   Medical History: Past Medical History:  Diagnosis Date  . Allergy   . Cancer ([place]) 04/2017   rectal cancer  . Cataract   . Chronic kidney disease 04/2017   left renal mass  . Glaucoma   . Obesity   . Pseudophakia of right eye 11/2012  . Pulmonary nodules 04/2017  . Thyroid nodule 04/2017   multiple    Medications:  Scheduled:  . aspirin  324 mg Oral NOW   Or  . aspirin  300 mg Rectal NOW   Infusions:    Assessment: 74 year old male who presented to Cardiologist as referral from PCP for chest pain and found to have remarkably abnormal EKG and troponin of 17.5. Patient was sent to [facility] with plans for cardiac cath in AM. Pharmacy consulted to start IV Heparin for ACS. Confirmed with the patient that he was not on anticoagulation prior to admission and that he has not had any trouble with bleeding in the recent past.   Labs from [facility]:  Hgb 14.9, Hct 44.1, Plts 260 SCr 1.30, Troponin 17.5   Goal of Therapy:  Heparin level 0.3-0.7 units/ml Monitor platelets by anticoagulation protocol: Yes   Plan:  Heparin bolus 4000 units Heparin drip at 1200 units/hr.  Heparin level in 6 hours.  Daily heparin level and CBC.  Monitor for any signs or symptoms of bleeding.   Sloan Leiter, PharmD, BCPS, BCCCP Clinical Pharmacist Please  refer to [facility] for [facility] Pharmacy numbers 07/23/2019,6:10 PM

## 2019-07-23 NOTE — Progress Notes (Addendum)
Cardiology Office Note:    Date:  07/23/2019   ID:  Jonathon Oneill, DOB 01/29/1946, MRN [id number]  PCP:  Nicoletta Dress, MD  Cardiologist:  Berniece Salines, DO  Electrophysiologist:  None   Referring MD: No ref. provider found   The patient was referred by his primary care provider for chest pain  History of Present Illness:    Jonathon Oneill is a 74 y.o. male with a hx of colorectal cancer status post surgery, chronic kidney disease, obesity, presents today to be evaluated for chest pain.  Patient tells me that 04 July 2014 he started experience significant midsternal chest tightness as well as dull aching pain.  He stated that the pain never radiated anywhere else.  He did mention that he had taken in eyedrops prior to his symptoms.  He denies any associated shortness of breath.  But did go to the emergency department at which time he was evaluated and he was recommended to be admitted for further evaluation in the inpatient setting but the patient left [place] during his evaluation.  He was referred by his PCP.  Today tells me that he does not have any symptoms.  He states since the day of his chest pain he has not had any recurrent symptoms.  Past Medical History:  Diagnosis Date  . Allergy   . Cancer ([place]) 04/2017   rectal cancer  . Cataract   . Chronic kidney disease 04/2017   left renal mass  . Glaucoma   . Obesity   . Pseudophakia of right eye 11/2012  . Pulmonary nodules 04/2017  . Thyroid nodule 04/2017   multiple    Past Surgical History:  Procedure Laterality Date  . COLON SURGERY  08/2017  . COLONOSCOPY  04/2017  . 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 FINGER SURGERY    . IR RADIOLOGIST EVAL & MGMT  06/15/2017  . IR RADIOLOGIST EVAL & MGMT  11/28/2017  . IR RADIOLOGIST EVAL & MGMT  01/10/2018  . IR RADIOLOGIST EVAL & MGMT  04/17/2018  . KIDNEY SURGERY      Current  Medications: Current Meds  Medication Sig  . Bilberry, Vaccinium myrtillus, (BILBERRY PO) Take 2 tablets by mouth daily.  . Cholecalciferol (VITAMIN D3) 2000 units capsule Take by mouth 3 x daily with food.  . latanoprost (XALATAN) 0.005 % ophthalmic solution Place 1 drop into both eyes nightly.  . loperamide (IMODIUM) 2 MG capsule Take 2 mg by mouth as needed for diarrhea or loose stools.  . TURMERIC PO Take 1 tablet by mouth daily.  . [DISCONTINUED] dorzolamide-timolol (COSOPT) 22.3-6.8 MG/ML ophthalmic solution Apply to eye.   Current Facility-Administered Medications for the 07/23/19 encounter (Office Visit) with Berniece Salines, DO  Medication  . 0.9 %  sodium chloride infusion     Allergies:   Influenza vaccines and Ciprofloxacin   Social History   Socioeconomic History  . Marital status: Married    Spouse name: Not on file  . Number of children: 4  . Years of education: Not on file  . Highest education level: Not on file  Occupational History  . Occupation: Retired   Tobacco Use  . Smoking status: Never Smoker  . Smokeless tobacco: Never Used  Substance and Sexual Activity  . Alcohol use: Not Currently  . Drug use: Never  . Sexual activity: Not on file  Other Topics  Concern  . Not on file  Social History Narrative  . Not on file   Social Determinants of Health   Financial Resource Strain:   . Difficulty of Paying Living Expenses: Not on file  Food Insecurity:   . Worried About Charity fundraiser in the Last Year: Not on file  . Ran Out of Food in the Last Year: Not on file  Transportation Needs:   . Lack of Transportation (Medical): Not on file  . Lack of Transportation (Non-Medical): Not on file  Physical Activity:   . Days of Exercise per Week: Not on file  . Minutes of Exercise per Session: Not on file  Stress:   . Feeling of Stress : Not on file  Social Connections:   . Frequency of Communication with Friends and Family: Not on file  . Frequency of  Social Gatherings with Friends and Family: Not on file  . Attends Religious Services: Not on file  . Active Member of Clubs or Organizations: Not on file  . Attends Archivist Meetings: Not on file  . Marital Status: Not on file     Family History: The patient's family history includes Colon cancer in his mother; Colon polyps in his mother; Esophageal cancer in his maternal great-grandfather; Rectal cancer in his mother. There is no history of Stomach cancer.  ROS:   Review of Systems  Constitution: Negative for decreased appetite, fever and weight gain.  HENT: Negative for congestion, ear discharge, hoarse voice and sore throat.   Eyes: Negative for discharge, redness, vision loss in right eye and visual halos.  Cardiovascular: Negative for chest pain, dyspnea on exertion, leg swelling, orthopnea and palpitations.  Respiratory: Negative for cough, hemoptysis, shortness of breath and snoring.   Endocrine: Negative for heat intolerance and polyphagia.  Hematologic/Lymphatic: Negative for bleeding problem. Does not bruise/bleed easily.  Skin: Negative for flushing, nail changes, rash and suspicious lesions.  Musculoskeletal: Negative for arthritis, joint pain, muscle cramps, myalgias, neck pain and stiffness.  Gastrointestinal: Negative for abdominal pain, bowel incontinence, diarrhea and excessive appetite.  Genitourinary: Negative for decreased libido, genital sores and incomplete emptying.  Neurological: Negative for brief paralysis, focal weakness, headaches and loss of balance.  Psychiatric/Behavioral: Negative for altered mental status, depression and suicidal ideas.  Allergic/Immunologic: Negative for HIV exposure and persistent infections.    EKGs/Labs/Other Studies Reviewed:    The following studies were reviewed today:   EKG:  The ekg ordered today demonstrates sinus rhythm, heart rate 111 bpm with ST changes in the precordial leads which is suspicious for recent  anterolateral infarction, compared to EKG done on 07/19/2018 patient did have acute T waves in the precordial leads at which time he was also in sinus rhythm with a heart rate of 85 bpm.  Recent Labs: No results found for requested labs within last 8760 hours.  WBC: 5.4, hemoglobin 15.2, hematocrit 45.4, platelets 210 , She: Sodium 139, potassium 4.7, chloride 103, bicarb 30, BUN 17, creatinine 1.0, BUN 121 Procalcitonin less than 0.05, D-dimer 199, Troponin 0 0.04  Recent Lipid Panel No results found for: CHOL, TRIG, HDL, CHOLHDL, VLDL, LDLCALC, LDLDIRECT  Physical Exam:    VS:  BP 110/82   Pulse (!) 111   Ht 5\' 8"  (1.727 m)   Wt 204 lb (92.5 kg)   SpO2 97%   BMI 31.02 kg/m     Wt Readings from Last 3 Encounters:  07/23/19 204 lb (92.5 kg)  09/07/18 204 lb (92.5 kg)  08/24/18 208 lb 6.4 oz (94.5 kg)     GEN: Well nourished, well developed in no acute distress HEENT: Normal NECK: No JVD; No carotid bruits LYMPHATICS: No lymphadenopathy CARDIAC: S1S2 noted,RRR, no murmurs, rubs, gallops RESPIRATORY:  Clear to auscultation without rales, wheezing or rhonchi  ABDOMEN: Soft, non-tender, non-distended, +bowel sounds, no guarding. EXTREMITIES: No edema, No cyanosis, no clubbing MUSCULOSKELETAL:  No edema; No deformity  SKIN: Warm and dry NEUROLOGIC:  Alert and oriented x 3, non-focal PSYCHIATRIC:  Normal affect, good insight  ASSESSMENT:    1. Chest pain, unspecified type   2. Pre-procedure lab exam   3. Abnormal EKG    PLAN:      The patient EKG is remarkably abnormal in the office today for changes that concerns me for ACS versus apical aneurysm.  He does not have any clinical symptoms to suggest acute syndrome.  However he does need an echocardiogram to rule out apical aneurysm and wall motion abnormality urgently. ( as bed side limited echo showed mid anterolateral wall, apical lateral hypokinesis with concern for apical aneurysm).   In addition a panel blood work will  be performed which would include troponin.  I have discussed my concern with the patient and told him why I would like this testing done.  I have made arrangements for the patient to be able to go to [facility] to get his echocardiogram done.  He served sitting and reluctant to do this testing as he is concerned about the bill.  Now he tells me that he will make his appointment which has been scheduled at 1:45 PM at [facility] for his echocardiogram.  His lab work will also be done at the hospital due to quick turnaround.  Once I am able to review his echocardiogram further recommendations will be made patient may include test for ischemic evaluation.  For now we will start the patient aspirin 81 mg daily, Crestor 10 mg daily as well as metoprolol succinate 12.5 mg daily.   Addendum 3:30 PM: I did receive a call from the hospital stating that the patient's troponin is 17.5.  I immediately called the patient and his daughter to inform them of these information.  The patient will need to be admitted.  We have made admissions for him to be admitted at [facility].  I was able to speak to the cardiologist who is on service at [facility] and discussed the case with him.  We discussed the plan for inpatient admission and left heart catheterization tomorrow.  The patient his daughter expresses understanding and he will be going to the hospital for this admission.  The patient is in agreement with the above plan. The patient left the office in stable condition.  The patient will follow up in 1 month or sooner if needed   Medication Adjustments/Labs and Tests Ordered: Current medicines are reviewed at length with the patient today.  Concerns regarding medicines are outlined above.  Orders Placed This Encounter  Procedures  . Troponin I  . Basic Metabolic Panel (BMET)  . CBC  . Magnesium  . ECHOCARDIOGRAM COMPLETE   Meds ordered this encounter  Medications  . aspirin EC  81 MG tablet    Sig: Take 1 tablet (81 mg total) by mouth daily.    Dispense:  90 tablet    Refill:  3  . rosuvastatin (CRESTOR) 10 MG tablet    Sig: Take 1 tablet (10 mg total) by mouth daily.  Dispense:  90 tablet    Refill:  1  . metoprolol succinate (TOPROL XL) 25 MG 24 hr tablet    Sig: Take 0.5 tablets (12.5 mg total) by mouth daily.    Dispense:  45 tablet    Refill:  1    Patient Instructions  Medication Instructions:  Your physician has recommended you make the following change in your medication:   START: Aspirin 81 mg Take 1 tab  Daily START: Crestor (rosuvastatin) 10 mg Take 1 tab daily START: Toprol XL ( metoprolol succinate ) 25 mg Take 1/2 tab daily  *If you need a refill on your cardiac medications before your next appointment, please call your pharmacy*  Lab Work: Your physician recommends that you return for lab work in: [place] Troponin,BMP,MAgnesium  If you have labs (blood work) drawn today and your tests are completely normal, you will receive your results only by: [facility] MyChart Message (if you have MyChart) OR . A paper copy in the mail If you have any lab test that is abnormal or we need to change your treatment, we will call you to review the results.  Testing/Procedures: Your physician has requested that you have an echocardiogram. Echocardiography is a painless test that uses sound waves to create images of your heart. It provides your doctor with information about the size and shape of your heart and how well your heart's chambers and valves are working. This procedure takes approximately one hour. There are no restrictions for this procedure.    Follow-Up: At [facility], you and your health needs are our priority.  As part of our continuing mission to provide you with exceptional heart care, we have created designated Provider Care Teams.  These Care Teams include your primary Cardiologist (physician) and Advanced Practice Providers (APPs -   Physician Assistants and Nurse Practitioners) who all work together to provide you with the care you need, when you need it.  Your next appointment:   4 week(s)  The format for your next appointment:   In Person  Provider:   Berniece Salines, DO  Other Instructions      Adopting a Healthy Lifestyle.  Know what a healthy weight is for you (roughly BMI <25) and aim to maintain this   Aim for 7+ servings of fruits and vegetables daily   65-80+ fluid ounces of water or unsweet tea for healthy kidneys   Limit to max 1 drink of alcohol per day; avoid smoking/tobacco   Limit animal fats in diet for cholesterol and heart health - choose grass fed whenever available   Avoid highly processed foods, and foods high in saturated/trans fats   Aim for low stress - take time to unwind and care for your mental health   Aim for 150 min of moderate intensity exercise weekly for heart health, and weights twice weekly for bone health   Aim for 7-9 hours of sleep daily   When it comes to diets, agreement about the perfect plan isnt easy to find, even among the experts. Experts at the [employer] developed an idea known as the Healthy Eating Plate. Just imagine a plate divided into logical, healthy portions.   The emphasis is on diet quality:   Load up on vegetables and fruits - one-half of your plate: Aim for color and variety, and remember that potatoes dont count.   Go for whole grains - one-quarter of your plate: Whole wheat, barley, wheat berries, quinoa, oats, brown rice, and  foods made with them. If you want pasta, go with whole wheat pasta.   Protein power - one-quarter of your plate: Fish, chicken, beans, and nuts are all healthy, versatile protein sources. Limit red meat.   The diet, however, does go beyond the plate, offering a few other suggestions.   Use healthy plant oils, such as olive, canola, soy, corn, sunflower and peanut. Check the labels, and avoid  partially hydrogenated oil, which have unhealthy trans fats.   If youre thirsty, drink water. Coffee and tea are good in moderation, but skip sugary drinks and limit milk and dairy products to one or two daily servings.   The type of carbohydrate in the diet is more important than the amount. Some sources of carbohydrates, such as vegetables, fruits, whole grains, and beans-are healthier than others.   Finally, stay active  Signed, Berniece Salines, DO  07/23/2019 12:53 PM    [facility]

## 2019-07-23 NOTE — Addendum Note (Signed)
Addended by: Particia Nearing B on: 07/23/2019 02:40 PM   Modules accepted: Orders

## 2019-07-23 NOTE — H&P (Signed)
   See Dr Harriet Masson note from today.   It is complete and includes the assessment and plan.   Rosaria Ferries, PA-C 07/23/2019 6:04 PM [facility] [phone]

## 2019-07-24 ENCOUNTER — Encounter (HOSPITAL_COMMUNITY)
Admission: AD | Disposition: A | Discharge status: Home / Self Care | Payer: Self-pay | Source: Home / Self Care | Attending: Cardiology

## 2019-07-24 ENCOUNTER — Encounter: Payer: Self-pay | Admitting: Cardiology

## 2019-07-24 DIAGNOSIS — N1831 Chronic kidney disease, stage 3a: Secondary | ICD-10-CM

## 2019-07-24 DIAGNOSIS — I2102 ST elevation (STEMI) myocardial infarction involving left anterior descending coronary artery: Secondary | ICD-10-CM

## 2019-07-24 DIAGNOSIS — I2510 Atherosclerotic heart disease of native coronary artery without angina pectoris: Secondary | ICD-10-CM

## 2019-07-24 DIAGNOSIS — E785 Hyperlipidemia, unspecified: Secondary | ICD-10-CM

## 2019-07-24 HISTORY — PX: CORONARY STENT INTERVENTION: CATH118234

## 2019-07-24 HISTORY — DX: Atherosclerotic heart disease of native coronary artery without angina pectoris: I25.10

## 2019-07-24 HISTORY — PX: LEFT HEART CATH AND CORONARY ANGIOGRAPHY: CATH118249

## 2019-07-24 LAB — COMPREHENSIVE METABOLIC PANEL
ALT: 30 U/L (ref 0–44)
AST: 46 U/L — ABNORMAL HIGH (ref 15–41)
Albumin: 3 g/dL — ABNORMAL LOW (ref 3.5–5.0)
Alkaline Phosphatase: 60 U/L (ref 38–126)
Anion gap: 11 (ref 5–15)
BUN: 25 mg/dL — ABNORMAL HIGH (ref 8–23)
CO2: 25 mmol/L (ref 22–32)
Calcium: 8.3 mg/dL — ABNORMAL LOW (ref 8.9–10.3)
Chloride: 97 mmol/L — ABNORMAL LOW (ref 98–111)
Creatinine, Ser: 1.24 mg/dL (ref 0.61–1.24)
GFR calc Af Amer: >60 mL/min (ref >60)
GFR calc non Af Amer: 58 mL/min — ABNORMAL LOW (ref >60)
Glucose, Bld: 105 mg/dL — ABNORMAL HIGH (ref 70–99)
Potassium: 3.7 mmol/L (ref 3.5–5.1)
Sodium: 133 mmol/L — ABNORMAL LOW (ref 135–145)
Total Bilirubin: 1.2 mg/dL (ref 0.3–1.2)
Total Protein: 6.8 g/dL (ref 6.5–8.1)

## 2019-07-24 LAB — CBC
HCT: 40.8 % (ref 39.0–52.0)
Hemoglobin: 13.8 g/dL (ref 13.0–17.0)
MCH: 29.4 pg (ref 26.0–34.0)
MCHC: 33.8 g/dL (ref 30.0–36.0)
MCV: 87 fL (ref 80.0–100.0)
Platelets: 217 10*3/uL (ref 150–400)
RBC: 4.69 MIL/uL (ref 4.22–5.81)
RDW: 13.3 % (ref 11.5–15.5)
WBC: 6.3 10*3/uL (ref 4.0–10.5)
nRBC: 0 % (ref 0.0–0.2)

## 2019-07-24 LAB — LIPID PANEL
Cholesterol: 195 mg/dL (ref 0–200)
HDL: 33 mg/dL — ABNORMAL LOW (ref >40)
LDL Cholesterol: 123 mg/dL — ABNORMAL HIGH (ref 0–99)
Total CHOL/HDL Ratio: 5.9 RATIO
Triglycerides: 194 mg/dL — ABNORMAL HIGH (ref <150)
VLDL: 39 mg/dL (ref 0–40)

## 2019-07-24 LAB — POCT ACTIVATED CLOTTING TIME
Activated Clotting Time: 257 seconds
Activated Clotting Time: 516 seconds
Activated Clotting Time: 841 seconds

## 2019-07-24 LAB — HEPARIN LEVEL (UNFRACTIONATED)
Heparin Unfractionated: 0.15 IU/mL — ABNORMAL LOW (ref 0.30–0.70)
Heparin Unfractionated: 0.17 IU/mL — ABNORMAL LOW (ref 0.30–0.70)

## 2019-07-24 LAB — SARS CORONAVIRUS 2 (TAT 6-24 HRS): SARS Coronavirus 2: NEGATIVE

## 2019-07-24 SURGERY — LEFT HEART CATH AND CORONARY ANGIOGRAPHY
Anesthesia: LOCAL

## 2019-07-24 MED ORDER — HEPARIN SODIUM (PORCINE) 1000 UNIT/ML IJ SOLN
INTRAMUSCULAR | Status: DC | PRN
  Administered 2019-07-24: 4500 [IU] via INTRAVENOUS
  Administered 2019-07-24: 3000 [IU] via INTRAVENOUS
  Administered 2019-07-24: 5000 [IU] via INTRAVENOUS

## 2019-07-24 MED ORDER — TICAGRELOR 90 MG PO TABS
ORAL_TABLET | ORAL | Status: AC
  Filled 2019-07-24: qty 1

## 2019-07-24 MED ORDER — TICAGRELOR 90 MG PO TABS
90.0000 mg | ORAL_TABLET | Freq: Two times a day (BID) | ORAL | Status: DC
  Administered 2019-07-24 – 2019-07-25 (×2): 90 mg via ORAL
  Filled 2019-07-24 (×2): qty 1

## 2019-07-24 MED ORDER — ACETAMINOPHEN 325 MG PO TABS: 650.0000 mg | ORAL_TABLET | ORAL | Status: DC | PRN

## 2019-07-24 MED ORDER — MIDAZOLAM HCL 2 MG/2ML IJ SOLN
INTRAMUSCULAR | Status: AC
  Filled 2019-07-24: qty 2

## 2019-07-24 MED ORDER — VERAPAMIL HCL 2.5 MG/ML IV SOLN
INTRAVENOUS | Status: AC
  Filled 2019-07-24: qty 2

## 2019-07-24 MED ORDER — TICAGRELOR 90 MG PO TABS
ORAL_TABLET | ORAL | Status: DC | PRN
  Administered 2019-07-24: 180 mg via ORAL

## 2019-07-24 MED ORDER — SODIUM CHLORIDE 0.9 % IV SOLN: 250.0000 mL | INTRAVENOUS | Status: DC | PRN

## 2019-07-24 MED ORDER — VERAPAMIL HCL 2.5 MG/ML IV SOLN
INTRAVENOUS | Status: DC | PRN
  Administered 2019-07-24: 10 mL via INTRA_ARTERIAL

## 2019-07-24 MED ORDER — MIDAZOLAM HCL 2 MG/2ML IJ SOLN
INTRAMUSCULAR | Status: DC | PRN
  Administered 2019-07-24: 2 mg via INTRAVENOUS
  Administered 2019-07-24: 1 mg via INTRAVENOUS

## 2019-07-24 MED ORDER — TIROFIBAN (AGGRASTAT) BOLUS VIA INFUSION
INTRAVENOUS | Status: DC | PRN
  Administered 2019-07-24: 15:00:00 2220 ug via INTRAVENOUS

## 2019-07-24 MED ORDER — NITROGLYCERIN 1 MG/10 ML FOR IR/CATH LAB
INTRA_ARTERIAL | Status: DC | PRN
  Administered 2019-07-24: 400 ug via INTRA_ARTERIAL

## 2019-07-24 MED ORDER — SODIUM CHLORIDE 0.9% FLUSH: 3.0000 mL | INTRAVENOUS | Status: DC | PRN

## 2019-07-24 MED ORDER — IOHEXOL 350 MG/ML SOLN
INTRAVENOUS | Status: DC | PRN
  Administered 2019-07-24: 165 mL

## 2019-07-24 MED ORDER — LIDOCAINE HCL (PF) 1 % IJ SOLN
INTRAMUSCULAR | Status: DC | PRN
  Administered 2019-07-24: 2 mL

## 2019-07-24 MED ORDER — METOPROLOL TARTRATE 12.5 MG HALF TABLET
12.5000 mg | ORAL_TABLET | Freq: Two times a day (BID) | ORAL | Status: DC
  Administered 2019-07-24 – 2019-07-25 (×2): 12.5 mg via ORAL
  Filled 2019-07-24 (×2): qty 1

## 2019-07-24 MED ORDER — SODIUM CHLORIDE 0.9 % WEIGHT BASED INFUSION: 1.0000 mL/kg/h | INTRAVENOUS | Status: DC

## 2019-07-24 MED ORDER — VERAPAMIL HCL 2.5 MG/ML IV SOLN
INTRAVENOUS | Status: DC | PRN
  Administered 2019-07-24 (×2): 200 ug via INTRACORONARY

## 2019-07-24 MED ORDER — HEPARIN (PORCINE) IN NACL 1000-0.9 UT/500ML-% IV SOLN
INTRAVENOUS | Status: DC | PRN
  Administered 2019-07-24 (×2): 500 mL

## 2019-07-24 MED ORDER — HEPARIN SODIUM (PORCINE) 1000 UNIT/ML IJ SOLN
INTRAMUSCULAR | Status: AC
  Filled 2019-07-24: qty 1

## 2019-07-24 MED ORDER — HYDRALAZINE HCL 20 MG/ML IJ SOLN: 10.0000 mg | INTRAMUSCULAR | Status: AC | PRN

## 2019-07-24 MED ORDER — ASPIRIN 81 MG PO CHEW
81.0000 mg | CHEWABLE_TABLET | Freq: Every day | ORAL | Status: DC
  Administered 2019-07-25: 81 mg via ORAL
  Filled 2019-07-24: qty 1

## 2019-07-24 MED ORDER — ONDANSETRON HCL 4 MG/2ML IJ SOLN: 4.0000 mg | Freq: Four times a day (QID) | INTRAMUSCULAR | Status: DC | PRN

## 2019-07-24 MED ORDER — LIDOCAINE HCL (PF) 1 % IJ SOLN
INTRAMUSCULAR | Status: AC
  Filled 2019-07-24: qty 30

## 2019-07-24 MED ORDER — LABETALOL HCL 5 MG/ML IV SOLN: 10.0000 mg | INTRAVENOUS | Status: AC | PRN

## 2019-07-24 MED ORDER — FENTANYL CITRATE (PF) 100 MCG/2ML IJ SOLN
INTRAMUSCULAR | Status: AC
  Filled 2019-07-24: qty 2

## 2019-07-24 MED ORDER — NITROGLYCERIN 1 MG/10 ML FOR IR/CATH LAB
INTRA_ARTERIAL | Status: AC
  Filled 2019-07-24: qty 10

## 2019-07-24 MED ORDER — ROSUVASTATIN CALCIUM 20 MG PO TABS
40.0000 mg | ORAL_TABLET | Freq: Every day | ORAL | Status: DC
  Administered 2019-07-24: 40 mg via ORAL
  Filled 2019-07-24: qty 2

## 2019-07-24 MED ORDER — SODIUM CHLORIDE 0.9% FLUSH
3.0000 mL | Freq: Two times a day (BID) | INTRAVENOUS | Status: DC
  Administered 2019-07-25: 10:00:00 3 mL via INTRAVENOUS

## 2019-07-24 MED ORDER — FENTANYL CITRATE (PF) 100 MCG/2ML IJ SOLN
INTRAMUSCULAR | Status: DC | PRN
  Administered 2019-07-24 (×2): 25 ug via INTRAVENOUS

## 2019-07-24 MED ORDER — SODIUM CHLORIDE 0.9 % WEIGHT BASED INFUSION
3.0000 mL/kg/h | INTRAVENOUS | Status: DC
  Administered 2019-07-24: 3 mL/kg/h via INTRAVENOUS

## 2019-07-24 MED ORDER — SODIUM CHLORIDE 0.9% FLUSH: 3.0000 mL | Freq: Two times a day (BID) | INTRAVENOUS | Status: DC

## 2019-07-24 MED ORDER — HEPARIN (PORCINE) IN NACL 1000-0.9 UT/500ML-% IV SOLN
INTRAVENOUS | Status: AC
  Filled 2019-07-24: qty 1000

## 2019-07-24 MED ORDER — SODIUM CHLORIDE 0.9 % IV SOLN: INTRAVENOUS | Status: AC

## 2019-07-24 SURGICAL SUPPLY — 28 items
BALLN SAPPHIRE 2.5X12 (BALLOONS) ×2
BALLN SAPPHIRE 2.5X15 (BALLOONS) ×2
BALLN SAPPHIRE 2.75X15 (BALLOONS) ×2
BALLN SAPPHIRE ~~LOC~~ 3.0X12 (BALLOONS) ×1 IMPLANT
BALLN SAPPHIRE ~~LOC~~ 3.25X10 (BALLOONS) ×1 IMPLANT
BALLOON SAPPHIRE 2.5X12 (BALLOONS) IMPLANT
BALLOON SAPPHIRE 2.5X15 (BALLOONS) IMPLANT
BALLOON SAPPHIRE 2.75X15 (BALLOONS) IMPLANT
CATH 5FR JL3.5 JR4 ANG PIG MP (CATHETERS) ×1 IMPLANT
CATH LAUNCHER 6FR EBU3.5 (CATHETERS) ×1 IMPLANT
CATH LAUNCHER 6FR JR4 (CATHETERS) ×1 IMPLANT
DEVICE RAD COMP TR BAND LRG (VASCULAR PRODUCTS) ×1 IMPLANT
GLIDESHEATH SLEND SS 6F .021 (SHEATH) ×1 IMPLANT
GUIDEWIRE INQWIRE 1.5J.035X260 (WIRE) IMPLANT
INQWIRE 1.5J .035X260CM (WIRE) ×2
KIT ENCORE 26 ADVANTAGE (KITS) ×1 IMPLANT
KIT HEART LEFT (KITS) ×2 IMPLANT
KIT HEMO VALVE WATCHDOG (MISCELLANEOUS) ×1 IMPLANT
PACK CARDIAC CATHETERIZATION (CUSTOM PROCEDURE TRAY) ×2 IMPLANT
SHEATH PROBE COVER 6X72 (BAG) ×1 IMPLANT
STENT SYNERGY XD 2.75X20 (Permanent Stent) IMPLANT
STENT SYNERGY XD 3.0X12 (Permanent Stent) IMPLANT
SYNERGY XD 2.75X20 (Permanent Stent) ×2 IMPLANT
SYNERGY XD 3.0X12 (Permanent Stent) ×2 IMPLANT
TRANSDUCER W/STOPCOCK (MISCELLANEOUS) ×2 IMPLANT
TUBING CIL FLEX 10 FLL-RA (TUBING) ×2 IMPLANT
WIRE ASAHI PROWATER 180CM (WIRE) ×1 IMPLANT
WIRE HI TORQ BMW 190CM (WIRE) ×1 IMPLANT

## 2019-07-24 NOTE — Progress Notes (Signed)
[place] for IV Heparin  Indication: chest pain/ACS  Allergies  Allergen Reactions  . Influenza Vaccines Nausea Only  . Ciprofloxacin Rash    Patient Measurements: Height: 5\' 8"  (172.7 cm) Weight: 194 lb 1.6 oz (88 kg) IBW/kg (Calculated) : 68.4 Heparin Dosing Weight: 86.3 kg  Vital Signs: Temp: 98.8 F (37.1 C) (01/19 2007) Temp Source: Oral (01/19 1829) BP: 102/72 (01/19 1829) Pulse Rate: 116 (01/19 1829)  Labs: Recent Labs    07/24/19 0105  HGB 13.8  HCT 40.8  PLT 217  HEPARINUNFRC 0.15*  CREATININE 1.24    Estimated Creatinine Clearance: 58 mL/min (by C-G formula based on SCr of 1.24 mg/dL).   Medical History: Past Medical History:  Diagnosis Date  . Allergy   . Cancer ([place]) 04/2017   rectal cancer  . Cataract   . Chronic kidney disease 04/2017   left renal mass  . Glaucoma   . Obesity   . Pseudophakia of right eye 11/2012  . Pulmonary nodules 04/2017  . Thyroid nodule 04/2017   multiple    Medications:  Scheduled:  . aspirin EC  81 mg Oral Daily  . latanoprost  1 drop Both Eyes QHS  . metoprolol succinate  12.5 mg Oral Daily  . rosuvastatin  10 mg Oral Daily   Infusions:  . heparin 1,200 Units/hr (07/23/19 2032)    Assessment: 74 year old male who presented to Cardiologist as referral from PCP for chest pain and found to have remarkably abnormal EKG and troponin of 17.5. Patient was sent to [facility] with plans for cardiac cath in AM. Pharmacy consulted to start IV Heparin for ACS. Confirmed with the patient that he was not on anticoagulation prior to admission and that he has not had any trouble with bleeding in the recent past.   Labs from [place]:  Hgb 14.9, Hct 44.1, Plts 260 SCr 1.30, Troponin 17.5   1/20 AM update:  Heparin level low  No issuers per RN  Goal of Therapy:  Heparin level 0.3-0.7 units/ml Monitor platelets by anticoagulation protocol: Yes   Plan:  Inc heparin to 1400  units/hr Re-check heparin level in 8 hours Cath today  Narda Bonds, PharmD, BCPS Clinical Pharmacist Phone: [phone]

## 2019-07-24 NOTE — Interval H&P Note (Signed)
Cath Lab Visit (complete for each Cath Lab visit)  Clinical Evaluation Leading to the Procedure:   ACS: Yes.    Non-ACS:    Anginal Classification: CCS IV  Anti-ischemic medical therapy: Minimal Therapy (1 class of medications)  Non-Invasive Test Results: No non-invasive testing performed  Prior CABG: No previous CABG      History and Physical Interval Note:  07/24/2019 1:41 PM  Jonathon Oneill  has presented today for surgery, with the diagnosis of unstable angina.  The various methods of treatment have been discussed with the patient and family. After consideration of risks, benefits and other options for treatment, the patient has consented to  Procedure(s): LEFT HEART CATH AND CORONARY ANGIOGRAPHY (N/A) as a surgical intervention.  The patient's history has been reviewed, patient examined, no change in status, stable for surgery.  I have reviewed the patient's chart and labs.  Questions were answered to the patient's satisfaction.     Larae Grooms

## 2019-07-24 NOTE — Progress Notes (Signed)
[place] for IV Heparin  Indication: chest pain/ACS  Allergies  Allergen Reactions  . Influenza Vaccines Nausea Only  . Ciprofloxacin Rash    Patient Measurements: Height: 5\' 8"  (172.7 cm) Weight: 195 lb 11.2 oz (88.8 kg) IBW/kg (Calculated) : 68.4 Heparin Dosing Weight: 86.3 kg  Vital Signs: Temp: 98.3 F (36.8 C) (01/20 0644) BP: 105/86 (01/20 0938) Pulse Rate: 91 (01/20 0938)  Labs: Recent Labs    07/24/19 0105 07/24/19 0915  HGB 13.8  --   HCT 40.8  --   PLT 217  --   HEPARINUNFRC 0.15* 0.17*  CREATININE 1.24  --     Estimated Creatinine Clearance: 58.3 mL/min (by C-G formula based on SCr of 1.24 mg/dL).   Medical History: Past Medical History:  Diagnosis Date  . Allergy   . Cancer ([place]) 04/2017   rectal cancer  . Cataract   . Chronic kidney disease 04/2017   left renal mass  . Glaucoma   . Obesity   . Pseudophakia of right eye 11/2012  . Pulmonary nodules 04/2017  . Thyroid nodule 04/2017   multiple    Medications:  Scheduled:  . aspirin EC  81 mg Oral Daily  . latanoprost  1 drop Both Eyes QHS  . metoprolol succinate  12.5 mg Oral Daily  . rosuvastatin  10 mg Oral Daily   Infusions:  . sodium chloride     Followed by  . sodium chloride    . heparin 1,400 Units/hr (07/24/19 0500)    Assessment: 74 year old male who presented to Cardiologist as referral from PCP for chest pain and found to have remarkably abnormal EKG and troponin of 17.5. Patient was sent to [facility] with plans for cardiac cath in AM. Pharmacy consulted to start IV Heparin for ACS. Confirmed with the patient that he was not on anticoagulation prior to admission and that he has not had any trouble with bleeding in the recent past.   Heparin level came back subtherapeutic at 0.17, on 1400 units/hr. Hgb 13.8, plt 217. No s/sx of bleeding or infusion issues.   Goal of Therapy:  Heparin level 0.3-0.7 units/ml Monitor platelets by  anticoagulation protocol: Yes   Plan:  Increase heparin to 1600 units/hr Re-check heparin level in 8 hours F/u plan for cath today?  Antonietta Jewel, PharmD, BCCCP Clinical Pharmacist  Phone: [phone]  Please check AMION for all [place] phone numbers After 10:00 PM, call [place] [phone]

## 2019-07-24 NOTE — Progress Notes (Signed)
Progress Note  Patient Name: Jonathon Oneill Date of Encounter: 07/24/2019  Primary Cardiologist: Berniece Salines, DO   Subjective   No angina or dyspnea at rest. Major chest pain event was 01/16.  Inpatient Medications    Scheduled Meds: . aspirin EC  81 mg Oral Daily  . latanoprost  1 drop Both Eyes QHS  . metoprolol succinate  12.5 mg Oral Daily  . rosuvastatin  10 mg Oral Daily  . sodium chloride flush  3 mL Intravenous Q12H   Continuous Infusions: . sodium chloride    . sodium chloride     Followed by  . sodium chloride    . heparin 1,400 Units/hr (07/24/19 0500)   PRN Meds: sodium chloride, acetaminophen, ALPRAZolam, loperamide, nitroGLYCERIN, ondansetron (ZOFRAN) IV, sodium chloride flush, zolpidem   Vital Signs    Vitals:   07/23/19 2007 07/24/19 0100 07/24/19 0644 07/24/19 0938  BP:   100/80 105/86  Pulse:   88 91  Resp:  16    Temp: 98.8 F (37.1 C)  98.3 F (36.8 C)   TempSrc:      SpO2:   97%   Weight:   88.8 kg   Height:        Intake/Output Summary (Last 24 hours) at 07/24/2019 1056 Last data filed at 07/24/2019 0500 Gross per 24 hour  Intake 381.63 ml  Output 1 ml  Net 380.63 ml   Last 3 Weights 07/24/2019 07/23/2019 07/23/2019  Weight (lbs) 195 lb 11.2 oz 194 lb 1.6 oz 204 lb  Weight (kg) 88.769 kg 88.043 kg 92.534 kg      Telemetry    NSR - Personally Reviewed  ECG    NSR, anteroseptal MI with persistent ST elevation V1-V4- Personally Reviewed  Physical Exam  Comfortable GEN: No acute distress.   Neck: No JVD Cardiac: RRR, no murmurs, rubs, or gallops.  Respiratory: Clear to auscultation bilaterally. GI: Soft, nontender, non-distended  MS: No edema; No deformity. Neuro:  Nonfocal  Psych: Normal affect   Labs    High Sensitivity Troponin:  No results for input(s): TROPONINIHS in the last 720 hours.    Chemistry Recent Labs  Lab 07/24/19 0105  NA 133*  K 3.7  CL 97*  CO2 25  GLUCOSE 105*  BUN 25*  CREATININE 1.24    CALCIUM 8.3*  PROT 6.8  ALBUMIN 3.0*  AST 46*  ALT 30  ALKPHOS 60  BILITOT 1.2  GFRNONAA 58*  GFRAA >60  ANIONGAP 11     Hematology Recent Labs  Lab 07/24/19 0105  WBC 6.3  RBC 4.69  HGB 13.8  HCT 40.8  MCV 87.0  MCH 29.4  MCHC 33.8  RDW 13.3  PLT 217    BNPNo results for input(s): BNP, PROBNP in the last 168 hours.   DDimer No results for input(s): DDIMER in the last 168 hours.   Radiology    No results found.  Cardiac Studies   "bed side limited echo showed mid anterolateral wall, apical lateral hypokinesis with concern for apical aneurysm"  Patient Profile     74 y.o. 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  Assessment & Plan    No ongoing angina and so far without signs/symptoms of CHF or postinfarction pericarditis. Needs formal echo. On for cardiac cath +/- PCI today. This procedure has been fully reviewed with the patient and written informed consent  has been obtained. Lipid profile suggests "metabolic syndrome" (low HDL, mildly elevated TG, probably small dense LDL). Discussed statin therapy and lifestyle changes.     For questions or updates, please contact [place] Please consult [email or web address] for contact info under        Signed, Sanda Klein, MD  07/24/2019, 10:56 AM

## 2019-07-24 NOTE — H&P (View-Only) (Signed)
Progress Note  Patient Name: Jonathon Oneill Date of Encounter: 07/24/2019  Primary Cardiologist: Berniece Salines, DO   Subjective   No angina or dyspnea at rest. Major chest pain event was 01/16.  Inpatient Medications    Scheduled Meds: . aspirin EC  81 mg Oral Daily  . latanoprost  1 drop Both Eyes QHS  . metoprolol succinate  12.5 mg Oral Daily  . rosuvastatin  10 mg Oral Daily  . sodium chloride flush  3 mL Intravenous Q12H   Continuous Infusions: . sodium chloride    . sodium chloride     Followed by  . sodium chloride    . heparin 1,400 Units/hr (07/24/19 0500)   PRN Meds: sodium chloride, acetaminophen, ALPRAZolam, loperamide, nitroGLYCERIN, ondansetron (ZOFRAN) IV, sodium chloride flush, zolpidem   Vital Signs    Vitals:   07/23/19 2007 07/24/19 0100 07/24/19 0644 07/24/19 0938  BP:   100/80 105/86  Pulse:   88 91  Resp:  16    Temp: 98.8 F (37.1 C)  98.3 F (36.8 C)   TempSrc:      SpO2:   97%   Weight:   88.8 kg   Height:        Intake/Output Summary (Last 24 hours) at 07/24/2019 1056 Last data filed at 07/24/2019 0500 Gross per 24 hour  Intake 381.63 ml  Output 1 ml  Net 380.63 ml   Last 3 Weights 07/24/2019 07/23/2019 07/23/2019  Weight (lbs) 195 lb 11.2 oz 194 lb 1.6 oz 204 lb  Weight (kg) 88.769 kg 88.043 kg 92.534 kg      Telemetry    NSR - Personally Reviewed  ECG    NSR, anteroseptal MI with persistent ST elevation V1-V4- Personally Reviewed  Physical Exam  Comfortable GEN: No acute distress.   Neck: No JVD Cardiac: RRR, no murmurs, rubs, or gallops.  Respiratory: Clear to auscultation bilaterally. GI: Soft, nontender, non-distended  MS: No edema; No deformity. Neuro:  Nonfocal  Psych: Normal affect   Labs    High Sensitivity Troponin:  No results for input(s): TROPONINIHS in the last 720 hours.    Chemistry Recent Labs  Lab 07/24/19 0105  NA 133*  K 3.7  CL 97*  CO2 25  GLUCOSE 105*  BUN 25*  CREATININE 1.24    CALCIUM 8.3*  PROT 6.8  ALBUMIN 3.0*  AST 46*  ALT 30  ALKPHOS 60  BILITOT 1.2  GFRNONAA 58*  GFRAA >60  ANIONGAP 11     Hematology Recent Labs  Lab 07/24/19 0105  WBC 6.3  RBC 4.69  HGB 13.8  HCT 40.8  MCV 87.0  MCH 29.4  MCHC 33.8  RDW 13.3  PLT 217    BNPNo results for input(s): BNP, PROBNP in the last 168 hours.   DDimer No results for input(s): DDIMER in the last 168 hours.   Radiology    No results found.  Cardiac Studies   "bed side limited echo showed mid anterolateral wall, apical lateral hypokinesis with concern for apical aneurysm"  Patient Profile     74 y.o. 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  Assessment & Plan    No ongoing angina and so far without signs/symptoms of CHF or postinfarction pericarditis. Needs formal echo. On for cardiac cath +/- PCI today. This procedure has been fully reviewed with the patient and written informed consent  has been obtained. Lipid profile suggests "metabolic syndrome" (low HDL, mildly elevated TG, probably small dense LDL). Discussed statin therapy and lifestyle changes.     For questions or updates, please contact [place] Please consult [email or web address] for contact info under        Signed, Sanda Klein, MD  07/24/2019, 10:56 AM

## 2019-07-25 ENCOUNTER — Inpatient Hospital Stay (HOSPITAL_COMMUNITY): Payer: Medicare PPO

## 2019-07-25 DIAGNOSIS — I5022 Chronic systolic (congestive) heart failure: Secondary | ICD-10-CM

## 2019-07-25 DIAGNOSIS — I214 Non-ST elevation (NSTEMI) myocardial infarction: Principal | ICD-10-CM

## 2019-07-25 DIAGNOSIS — I255 Ischemic cardiomyopathy: Secondary | ICD-10-CM

## 2019-07-25 DIAGNOSIS — I2510 Atherosclerotic heart disease of native coronary artery without angina pectoris: Secondary | ICD-10-CM

## 2019-07-25 LAB — BASIC METABOLIC PANEL
Anion gap: 12 (ref 5–15)
BUN: 17 mg/dL (ref 8–23)
CO2: 23 mmol/L (ref 22–32)
Calcium: 8.3 mg/dL — ABNORMAL LOW (ref 8.9–10.3)
Chloride: 102 mmol/L (ref 98–111)
Creatinine, Ser: 1.08 mg/dL (ref 0.61–1.24)
GFR calc Af Amer: >60 mL/min (ref >60)
GFR calc non Af Amer: >60 mL/min (ref >60)
Glucose, Bld: 110 mg/dL — ABNORMAL HIGH (ref 70–99)
Potassium: 3.5 mmol/L (ref 3.5–5.1)
Sodium: 137 mmol/L (ref 135–145)

## 2019-07-25 LAB — CBC
HCT: 38.3 % — ABNORMAL LOW (ref 39.0–52.0)
Hemoglobin: 12.8 g/dL — ABNORMAL LOW (ref 13.0–17.0)
MCH: 29.4 pg (ref 26.0–34.0)
MCHC: 33.4 g/dL (ref 30.0–36.0)
MCV: 88 fL (ref 80.0–100.0)
Platelets: 240 10*3/uL (ref 150–400)
RBC: 4.35 MIL/uL (ref 4.22–5.81)
RDW: 13.2 % (ref 11.5–15.5)
WBC: 5.1 10*3/uL (ref 4.0–10.5)
nRBC: 0 % (ref 0.0–0.2)

## 2019-07-25 LAB — ECHOCARDIOGRAM COMPLETE
Height: 68 in
Weight: 3178.15 oz

## 2019-07-25 MED ORDER — NITROGLYCERIN 0.4 MG SL SUBL: 0.4000 mg | SUBLINGUAL_TABLET | SUBLINGUAL | 3 refills | Status: DC | PRN

## 2019-07-25 MED ORDER — LOSARTAN POTASSIUM 25 MG PO TABS: 12.5000 mg | ORAL_TABLET | Freq: Every day | ORAL | 3 refills | Status: DC

## 2019-07-25 MED ORDER — TICAGRELOR 90 MG PO TABS: 90.0000 mg | ORAL_TABLET | Freq: Two times a day (BID) | ORAL | 3 refills | Status: DC

## 2019-07-25 MED ORDER — METOPROLOL SUCCINATE ER 25 MG PO TB24: 25.0000 mg | ORAL_TABLET | Freq: Every day | ORAL | 1 refills | Status: DC

## 2019-07-25 MED ORDER — ROSUVASTATIN CALCIUM 40 MG PO TABS: 40.0000 mg | ORAL_TABLET | Freq: Every day | ORAL | 3 refills | Status: DC

## 2019-07-25 NOTE — Care Management (Signed)
Per Arbel L. W/[employer] Brilinta 90mg . 2 a day for 30 day supply $40.00. Pablo for 90 day supply $80.00.  No PA required No Deductible Tier 2 medication Architect.  Mail order pharmacy : [facility]

## 2019-07-25 NOTE — Discharge Instructions (Signed)
Radial Site Care  This sheet gives you information about how to care for yourself after your procedure. Your health care provider may also give you more specific instructions. If you have problems or questions, contact your health care provider. What can I expect after the procedure? After the procedure, it is common to have:  Bruising and tenderness at the catheter insertion area. Follow these instructions at home: Medicines  Take over-the-counter and prescription medicines only as told by your health care provider. Insertion site care  Follow instructions from your health care provider about how to take care of your insertion site. Make sure you: ? Wash your hands with soap and water before you change your bandage (dressing). If soap and water are not available, use hand sanitizer. ? Change your dressing as told by your health care provider. ? Leave stitches (sutures), skin glue, or adhesive strips in place. These skin closures may need to stay in place for 2 weeks or longer. If adhesive strip edges start to loosen and curl up, you may trim the loose edges. Do not remove adhesive strips completely unless your health care provider tells you to do that.  Check your insertion site every day for signs of infection. Check for: ? Redness, swelling, or pain. ? Fluid or blood. ? Pus or a bad smell. ? Warmth.  Do not take baths, swim, or use a hot tub until your health care provider approves.  You may shower 24-48 hours after the procedure, or as directed by your health care provider. ? Remove the dressing and gently wash the site with plain soap and water. ? Pat the area dry with a clean towel. ? Do not rub the site. That could cause bleeding.  Do not apply powder or lotion to the site. Activity   For 24 hours after the procedure, or as directed by your health care provider: ? Do not flex or bend the affected arm. ? Do not push or pull heavy objects with the affected arm. ? Do not  drive yourself home from the hospital or clinic. You may drive 24 hours after the procedure unless your health care provider tells you not to. ? Do not operate machinery or power tools.  Do not lift anything that is heavier than 10 lb (4.5 kg), or the limit that you are told, until your health care provider says that it is safe.  Ask your health care provider when it is okay to: ? Return to work or school. ? Resume usual physical activities or sports. ? Resume sexual activity. General instructions  If the catheter site starts to bleed, raise your arm and put firm pressure on the site. If the bleeding does not stop, get help right away. This is a medical emergency.  If you went home on the same day as your procedure, a responsible adult should be with you for the first 24 hours after you arrive home.  Keep all follow-up visits as told by your health care provider. This is important. Contact a health care provider if:  You have a fever.  You have redness, swelling, or yellow drainage around your insertion site. Get help right away if:  You have unusual pain at the radial site.  The catheter insertion area swells very fast.  The insertion area is bleeding, and the bleeding does not stop when you hold steady pressure on the area.  Your arm or hand becomes pale, cool, tingly, or numb. These symptoms may represent a serious problem   that is an emergency. Do not wait to see if the symptoms will go away. Get medical help right away. Call your local emergency services (911 in the [place] Do not drive yourself to the hospital. Summary  After the procedure, it is common to have bruising and tenderness at the site.  Follow instructions from your health care provider about how to take care of your radial site wound. Check the wound every day for signs of infection.  Do not lift anything that is heavier than 10 lb (4.5 kg), or the limit that you are told, until your health care provider says  that it is safe. This information is not intended to replace advice given to you by your health care provider. Make sure you discuss any questions you have with your health care provider. Document Revised: 07/26/2017 Document Reviewed: 07/26/2017 Elsevier Patient Education  2020 [employer]    Heart-Healthy Eating Plan Heart-healthy meal planning includes:  Eating less unhealthy fats.  Eating more healthy fats.  Making other changes in your diet. Talk with your doctor or a diet specialist (dietitian) to create an eating plan that is right for you. What is my plan? Your doctor may recommend an eating plan that includes:  Total fat: ______% or less of total calories a day.  Saturated fat: ______% or less of total calories a day.  Cholesterol: less than _________mg a day. What are tips for following this plan? Cooking Avoid frying your food. Try to bake, boil, grill, or broil it instead. You can also reduce fat by:  Removing the skin from poultry.  Removing all visible fats from meats.  Steaming vegetables in water or broth. Meal planning   At meals, divide your plate into four equal parts: ? Fill one-half of your plate with vegetables and green salads. ? Fill one-fourth of your plate with whole grains. ? Fill one-fourth of your plate with lean protein foods.  Eat 4-5 servings of vegetables per day. A serving of vegetables is: ? 1 cup of raw or cooked vegetables. ? 2 cups of raw leafy greens.  Eat 4-5 servings of fruit per day. A serving of fruit is: ? 1 medium whole fruit. ?  cup of dried fruit. ?  cup of fresh, frozen, or canned fruit. ?  cup of 100% fruit juice.  Eat more foods that have soluble fiber. These are apples, broccoli, carrots, beans, peas, and barley. Try to get 20-30 g of fiber per day.  Eat 4-5 servings of nuts, legumes, and seeds per week: ? 1 serving of dried beans or legumes equals  cup after being cooked. ? 1 serving of nuts is   cup. ? 1 serving of seeds equals 1 tablespoon. General information  Eat more home-cooked food. Eat less restaurant, buffet, and fast food.  Limit or avoid alcohol.  Limit foods that are high in starch and sugar.  Avoid fried foods.  Lose weight if you are overweight.  Keep track of how much salt (sodium) you eat. This is important if you have high blood pressure. Ask your doctor to tell you more about this.  Try to add vegetarian meals each week. Fats  Choose healthy fats. These include olive oil and canola oil, flaxseeds, walnuts, almonds, and seeds.  Eat more omega-3 fats. These include salmon, mackerel, sardines, tuna, flaxseed oil, and ground flaxseeds. Try to eat fish at least 2 times each week.  Check food labels. Avoid foods with trans fats or high amounts of saturated fat.    Limit saturated fats. ? These are often found in animal products, such as meats, butter, and cream. ? These are also found in plant foods, such as palm oil, palm kernel oil, and coconut oil.  Avoid foods with partially hydrogenated oils in them. These have trans fats. Examples are stick margarine, some tub margarines, cookies, crackers, and other baked goods. What foods can I eat? Fruits All fresh, canned (in natural juice), or frozen fruits. Vegetables Fresh or frozen vegetables (raw, steamed, roasted, or grilled). Green salads. Grains Most grains. Choose whole wheat and whole grains most of the time. Rice and pasta, including brown rice and pastas made with whole wheat. Meats and other proteins Lean, well-trimmed beef, veal, pork, and lamb. Chicken and turkey without skin. All fish and shellfish. Wild duck, rabbit, pheasant, and venison. Egg whites or low-cholesterol egg substitutes. Dried beans, peas, lentils, and tofu. Seeds and most nuts. Dairy Low-fat or nonfat cheeses, including ricotta and mozzarella. Skim or 1% milk that is liquid, powdered, or evaporated. Buttermilk that is made with  low-fat milk. Nonfat or low-fat yogurt. Fats and oils Non-hydrogenated (trans-free) margarines. Vegetable oils, including soybean, sesame, sunflower, olive, peanut, safflower, corn, canola, and cottonseed. Salad dressings or mayonnaise made with a vegetable oil. Beverages Mineral water. Coffee and tea. Diet carbonated beverages. Sweets and desserts Sherbet, gelatin, and fruit ice. Small amounts of dark chocolate. Limit all sweets and desserts. Seasonings and condiments All seasonings and condiments. The items listed above may not be a complete list of foods and drinks you can eat. Contact a dietitian for more options. What foods should I avoid? Fruits Canned fruit in heavy syrup. Fruit in cream or butter sauce. Fried fruit. Limit coconut. Vegetables Vegetables cooked in cheese, cream, or butter sauce. Fried vegetables. Grains Breads that are made with saturated or trans fats, oils, or whole milk. Croissants. Sweet rolls. Donuts. High-fat crackers, such as cheese crackers. Meats and other proteins Fatty meats, such as hot dogs, ribs, sausage, bacon, rib-eye roast or steak. High-fat deli meats, such as salami and bologna. Caviar. Domestic duck and goose. Organ meats, such as liver. Dairy Cream, sour cream, cream cheese, and creamed cottage cheese. Whole-milk cheeses. Whole or 2% milk that is liquid, evaporated, or condensed. Whole buttermilk. Cream sauce or high-fat cheese sauce. Yogurt that is made from whole milk. Fats and oils Meat fat, or shortening. Cocoa butter, hydrogenated oils, palm oil, coconut oil, palm kernel oil. Solid fats and shortenings, including bacon fat, salt pork, lard, and butter. Nondairy cream substitutes. Salad dressings with cheese or sour cream. Beverages Regular sodas and juice drinks with added sugar. Sweets and desserts Frosting. Pudding. Cookies. Cakes. Pies. Milk chocolate or white chocolate. Buttered syrups. Full-fat ice cream or ice cream drinks. The items  listed above may not be a complete list of foods and drinks to avoid. Contact a dietitian for more information. Summary  Heart-healthy meal planning includes eating less unhealthy fats, eating more healthy fats, and making other changes in your diet.  Eat a balanced diet. This includes fruits and vegetables, low-fat or nonfat dairy, lean protein, nuts and legumes, whole grains, and heart-healthy oils and fats. This information is not intended to replace advice given to you by your health care provider. Make sure you discuss any questions you have with your health care provider. Document Revised: 08/24/2017 Document Reviewed: 07/28/2017 Elsevier Patient Education  2020 Elsevier Inc.  

## 2019-07-25 NOTE — Progress Notes (Signed)
CARDIAC REHAB PHASE I   PRE:  Rate/Rhythm: 79 SR  BP:  Supine: 104/77  Sitting:   Standing:    SaO2: 94%RA  MODE:  Ambulation: 470 ft   POST:  Rate/Rhythm: 104 ST  BP:  Supine:   Sitting: 119/87  Standing:    SaO2: 99%RA 0810-0935 Pt walked 470 ft on RA with handheld asst. Gait steady and no c/o CP. Tolerated well. Pt brushed teeth prior to walk standing at sink. Gave pt MI and CHF booklets. Discussed importance of brilinta several times as pt concerned with cost and side effects. Discussed that he needs antiplatelet med now for about a year or his stents would close up (possibly in just a few days). Pt asked if he could just take 2 regular aspirins. Discussed with pt that is not an option.  Told pt that if he cannot afford brilinta in future that he can discuss with cardiologist but would get first month free of brilinta. Discussed with PA my concern that pt may not take brilinta faithfully. Reviewed MI restrictions, NTG use, heart healthy and low sodium food choices, walking for ex, daily weights, signs of CHF, and CRP 2. Referred to [place] CRP 2 but doubtful pt will attend. He stated he likes to walk for ex so walking instructions given. Turned pages down on what we discussed in MI and CHF books and encouraged him to have daughter read also. Pt seemed a little overwhelmed by all the information. Emotional support given.   Graylon Good, RN BSN  07/25/2019 9:25 AM

## 2019-07-25 NOTE — Discharge Summary (Addendum)
Discharge Summary    Patient ID: Jonathon Oneill MRN: [id number]; DOB: 1946/01/16  Admit date: 07/23/2019 Discharge date: 07/25/2019  Primary Care Provider: Nicoletta Dress, MD  Primary Cardiologist: Berniece Salines, DO  Primary Electrophysiologist:  None   Discharge Diagnoses    Principal Problem:   Non-ST elevation (NSTEMI) myocardial infarction [facility]) Active Problems:   Ischemic cardiomyopathy   Chronic systolic heart failure ([place])   CAD (coronary artery disease), native coronary artery   Chronic kidney disease   Obesity    Diagnostic Studies/Procedures    Left heart cath 07/24/19:  There is mild to moderate left ventricular systolic dysfunction.  LV end diastolic pressure is normal.  The left ventricular ejection fraction is 35-45% by visual estimate.  There is no aortic valve stenosis.  Mid LAD lesion is 95% stenosed.  A drug-eluting stent was successfully placed using a SYNERGY XD 2.75X20, postdilated to 3.0 mm.  Post intervention, there is a 0% residual stenosis.  Dist RCA lesion is 80% stenosed.  A drug-eluting stent was successfully placed using a SYNERGY XD 3.0X12, postdilated to 3.25 mm.  Post intervention, there is a 0% residual stenosis.   Continue DAPT with medical therapy for CHF.    _____________   Echo 07/25/19: final read pending   History of Present Illness     Jonathon Oneill is a 74 y.o. 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  Jonathon Oneill is a 74 y.o. male with a hx of colorectal cancer status post surgery, chronic kidney disease, obesity, presented 07/23/19 to be evaluated for chest pain. Patient was seen by Dr.Tobb and reported that on  07/20/19 he started experience significant midsternal chest tightness as well as dull aching pain.  He stated that the pain never radiated anywhere else.  He did mention that he had  taken in eyedrops prior to his symptoms.  He denies any associated shortness of breath.  But did go to the emergency department at which time he was evaluated and he was recommended to be admitted for further evaluation in the inpatient setting. The patient left AGAINST MEDICAL ADVICE during his evaluation. Prior to leaving, labs did show elevated troponin of 17.5 with a sCr 1.3   He was referred by his PCP.  The patient denied symptoms at the time of his office visit on 07/23/19 does not have any symptoms.  He states since the day of his chest pain he has not had any recurrent symptoms.  Hospital Course     Consultants: none  Chest pain Late presentation of MI There was concern for late presenting MI. He was given ASA, statin, and lopressor. Heparin gtt was started.  Echo report from [facility] was not available. He was transferred to [facility] for heart catheterization. Angiography on 07/24/19 revealed EF of 35-45%, 95% stenosis of mid LAD treated with dES and 80% distal RCA lesion treated with DES. The patient tolerated the procedure well. He was started on ASA and brlinta. Continue BB and statin. Aggressive risk factor management. A1c was 5.7%. He is a never smoker.   Hyperlipidemia with LDL goal < 70 07/24/2019: Cholesterol 195; HDL 33; LDL Cholesterol 123; Triglycerides 194; VLDL 39 Started on 40 mg crestor. Will need fasting labs in 6 weeks.   Systolic heart failure - new diagnosis Ischemic cardiomyopathy EF estimated at heart cath was 35-45% with normal LVEDP.  Current regimen includes DAPT,  BB, statin. Started 12.5 mg losartan He appears euvolemic today. Discussed sodium and fluid restriction. Repeat echocardiogram in 3 months to evaluate function and need for ICD.   CKD  sCr today is 1.08 with k of 3.5.  Would favor starting ARB with close monitoring of his renal function. However, pressure is marginal. Will defer to the outpatient setting.   Pt seen and examined by Dr.  Sallyanne Kuster and deemed stable for discharge following echocardiogram.   Did the patient have an acute coronary syndrome (MI, NSTEMI, STEMI, etc) this admission?:  Yes                               AHA/ACC Clinical Performance & Quality Measures: 1. Aspirin prescribed? - Yes 2. ADP Receptor Inhibitor (Plavix/Clopidogrel, Brilinta/Ticagrelor or Effient/Prasugrel) prescribed (includes medically managed patients)? - Yes 3. Beta Blocker prescribed? - Yes 4. High Intensity Statin (Lipitor 40-80mg  or Crestor 20-40mg ) prescribed? - Yes 5. EF assessed during THIS hospitalization? - Yes 6. For EF <40%, was ACEI/ARB prescribed? - No - Reason:  marginal pressure 7. For EF <40%, Aldosterone Antagonist (Spironolactone or Eplerenone) prescribed? - No - Reason:  marginal pressure 8. Cardiac Rehab Phase II ordered (Included Medically managed Patients)? - Yes   _____________  Discharge Vitals Blood pressure 108/76, pulse 79, temperature 99.1 F (37.3 C), temperature source Oral, resp. rate 15, height 5\' 8"  (1.727 m), weight 90.1 kg, SpO2 97 %.  Filed Weights   07/23/19 1829 07/24/19 0644 07/25/19 0300  Weight: 88 kg 88.8 kg 90.1 kg    Labs & Radiologic Studies    CBC Recent Labs    07/24/19 0105 07/25/19 0355  WBC 6.3 5.1  HGB 13.8 12.8*  HCT 40.8 38.3*  MCV 87.0 88.0  PLT 217 [id number]   Basic Metabolic Panel Recent Labs    07/24/19 0105 07/25/19 0355  NA 133* 137  K 3.7 3.5  CL 97* 102  CO2 25 23  GLUCOSE 105* 110*  BUN 25* 17  CREATININE 1.24 1.08  CALCIUM 8.3* 8.3*   Liver Function Tests Recent Labs    07/24/19 0105  AST 46*  ALT 30  ALKPHOS 60  BILITOT 1.2  PROT 6.8  ALBUMIN 3.0*   No results for input(s): LIPASE, AMYLASE in the last 72 hours. High Sensitivity Troponin:   No results for input(s): TROPONINIHS in the last 720 hours.  BNP Invalid input(s): POCBNP D-Dimer No results for input(s): DDIMER in the last 72 hours. Hemoglobin A1C Recent Labs    07/23/19 1831    HGBA1C 5.7*   Fasting Lipid Panel Recent Labs    07/24/19 0105  CHOL 195  HDL 33*  LDLCALC 123*  TRIG 194*  CHOLHDL 5.9   Thyroid Function Tests Recent Labs    07/23/19 1831  TSH 8.277*   _____________  CARDIAC CATHETERIZATION  Result Date: 07/24/2019  There is mild to moderate left ventricular systolic dysfunction.  LV end diastolic pressure is normal.  The left ventricular ejection fraction is 35-45% by visual estimate.  There is no aortic valve stenosis.  Mid LAD lesion is 95% stenosed.  A drug-eluting stent was successfully placed using a SYNERGY XD 2.75X20, postdilated to 3.0 mm.  Post intervention, there is a 0% residual stenosis.  Dist RCA lesion is 80% stenosed.  A drug-eluting stent was successfully placed using a SYNERGY XD 3.0X12, postdilated to 3.25 mm.  Post intervention, there is a 0% residual stenosis.  Continue  DAPT with medical therapy for CHF.    Disposition   Pt is being discharged home today in good condition.  Follow-up Plans & Appointments    Follow-up Information    Tobb, Godfrey Pick, DO Follow up on 08/02/2019.   Specialty: Cardiology Why: 8:05 am for [facility] Contact information: [place] [address] [phone]          Discharge Instructions    Amb Referral to Cardiac Rehabilitation   Complete by: As directed    Referring to [place] [facility] 2   Diagnosis:  Coronary Stents NSTEMI     After initial evaluation and assessments completed: Virtual Based Care may be provided alone or in conjunction with Phase 2 Cardiac Rehab based on patient barriers.: Yes   Diet - low sodium heart healthy   Complete by: As directed    Discharge instructions   Complete by: As directed    No driving for 2 days. No lifting over 5 lbs for 1 week. No sexual activity for 1 week. Keep procedure site clean & dry. If you notice increased pain, swelling, bleeding or pus, call/return!  You may shower, but no soaking baths/hot tubs/pools for 1 week.    Increase activity slowly   Complete by: As directed       Discharge Medications   Allergies as of 07/25/2019      Reactions   Influenza Vaccines Nausea Only   Ciprofloxacin Rash      Medication List    TAKE these medications   aspirin EC 81 MG tablet Take 1 tablet (81 mg total) by mouth daily.   BILBERRY PO Take 2 tablets by mouth daily.   latanoprost 0.005 % ophthalmic solution Commonly known as: XALATAN Place 1 drop into both eyes at bedtime.   loperamide 2 MG capsule Commonly known as: IMODIUM Take 2 mg by mouth as needed for diarrhea or loose stools.   losartan 25 MG tablet Commonly known as: Cozaar Take 0.5 tablets (12.5 mg total) by mouth daily.   metoprolol succinate 25 MG 24 hr tablet Commonly known as: Toprol XL Take 1 tablet (25 mg total) by mouth daily. What changed: how much to take   nitroGLYCERIN 0.4 MG SL tablet Commonly known as: NITROSTAT Place 1 tablet (0.4 mg total) under the tongue every 5 (five) minutes x 3 doses as needed for chest pain.   rosuvastatin 40 MG tablet Commonly known as: CRESTOR Take 1 tablet (40 mg total) by mouth daily at 6 PM. What changed:   medication strength  how much to take  when to take this   ticagrelor 90 MG Tabs tablet Commonly known as: BRILINTA Take 1 tablet (90 mg total) by mouth 2 (two) times daily.   TURMERIC PO Take 1 tablet by mouth daily.   Vitamin D3 50 MCG (2000 UT) capsule Take 2,000 Units by mouth at bedtime.          Outstanding Labs/Studies   Fasting lipids in 6 weeks  Echo in 3 months   Duration of Discharge Encounter   Greater than 30 minutes including physician time.  Signed, Tami Lin Duke, PA 07/25/2019, 9:43 AM  I have seen and examined the patient along with Ledora Bottcher, PA.  I have reviewed the chart, notes and new data.  I agree with PA/NP's note.  Key new complaints: no angina or dyspnea Key examination changes: no signs of hypervolemia, normal rhythm,  no hematoma at cath access site  General: Alert, oriented x3,  no distress Head: no evidence of trauma, PERRL, EOMI, no exophtalmos or lid lag, no myxedema, no xanthelasma; normal ears, nose and oropharynx Neck: normal jugular venous pulsations and no hepatojugular reflux; brisk carotid pulses without delay and no carotid bruits Chest: clear to auscultation, no signs of consolidation by percussion or palpation, normal fremitus, symmetrical and full respiratory excursions Cardiovascular: normal position and quality of the apical impulse, regular rhythm, normal first and second heart sounds, no murmurs, rubs. S4 gallop is present. Abdomen: no tenderness or distention, no masses by palpation, no abnormal pulsatility or arterial bruits, normal bowel sounds, no hepatosplenomegaly Extremities: healthy R radial access without ecchymosis or hematoma;no clubbing, cyanosis or edema; 2+ radial, ulnar and brachial pulses bilaterally; 2+ right femoral, posterior tibial and dorsalis pedis pulses; 2+ left femoral, posterior tibial and dorsalis pedis pulses; no subclavian or femoral bruits Neurological: grossly nonfocal Psych: Normal mood and affect  Key new findings / data: echo still pending; angiograms rev'd with Dr. Irish Lack. Discussed with patient and his daughter Jonathon Oneill. LVEDP 15.  PLAN: 1. CAD (late presentation anterior STEMI, s/p DES-LAD and DES-RCA):  No residual angina. Reviewed mandatory dual antiplatelet therapy for 12 months and importance of long term lipid lowering therapy (LDL<70, HDL>45). 2. Moderate LV dysfunction: formal echo pending, but there is ECG and angiographic evidence of early LV apical aneurysm formation. At risk for CHF, but no clinical HF to date. Discussed signs and symptoms of HF, need for weight monitoring, sodium restriction. On beta blocker. Add low dose ARB (titration may be limited by BP). Review need for ICD after follow up echo in 3 months.  3. HLP:  Typical "metabolic sd  patter. On high dose statin. Long term need for increased activity and weight los to improve HDL.  Sanda Klein, MD, [place] [phone] 07/25/2019, 9:57 AM

## 2019-07-25 NOTE — Care Management (Signed)
1018 07-25-19 Benefits Check submitted for Brilinta.Case Manger will discuss the price with the patient. Patient's medications to be delivered to the room via the transition of care pharmacy. No further needs from Case Manager at this time. Graves-Bigelow, Ocie Cornfield, RN, BSN Case Manager

## 2019-07-25 NOTE — Progress Notes (Signed)
  Echocardiogram 2D Echocardiogram has been performed.  Jonathon Oneill 07/25/2019, 10:43 AM

## 2019-07-25 NOTE — Progress Notes (Signed)
Removed TR band from right radial. Site is a level 0. Applied gauze and Tegaderm to site. Educated to leave in place for 24 hrs and right arm is still NWB. Pt verbalized understanding.

## 2019-08-02 ENCOUNTER — Ambulatory Visit (INDEPENDENT_AMBULATORY_CARE_PROVIDER_SITE_OTHER): Payer: Medicare PPO | Admitting: Cardiology

## 2019-08-02 ENCOUNTER — Encounter: Payer: Self-pay | Admitting: Cardiology

## 2019-08-02 ENCOUNTER — Other Ambulatory Visit: Payer: Self-pay

## 2019-08-02 VITALS — BP 110/80 | HR 82 | Ht 68.0 in | Wt 197.0 lb

## 2019-08-02 DIAGNOSIS — I2510 Atherosclerotic heart disease of native coronary artery without angina pectoris: Secondary | ICD-10-CM

## 2019-08-02 DIAGNOSIS — I255 Ischemic cardiomyopathy: Secondary | ICD-10-CM

## 2019-08-02 DIAGNOSIS — I214 Non-ST elevation (NSTEMI) myocardial infarction: Secondary | ICD-10-CM | POA: Diagnosis not present

## 2019-08-02 DIAGNOSIS — E669 Obesity, unspecified: Secondary | ICD-10-CM | POA: Diagnosis not present

## 2019-08-02 DIAGNOSIS — E782 Mixed hyperlipidemia: Secondary | ICD-10-CM

## 2019-08-02 HISTORY — DX: Mixed hyperlipidemia: E78.2

## 2019-08-02 NOTE — Progress Notes (Signed)
Cardiology Office Note:    Date:  08/02/2019   ID:  Jonathon Oneill, DOB Mar 23, 1946, MRN [id number]  PCP:  Jonathon Dress, MD  Cardiologist:  Jonathon Salines, DO  Electrophysiologist:  None   Referring MD: Jonathon Dress, MD   Chief Complaint  Patient presents with  . Hospitalization Follow-up    History of Present Illness:    Jonathon Oneill is a 74 y.o. male with a hx of coronary artery disease status post stent to his LAD and RCA on April 22, 2020, ischemic cardiomyopathy EF 30 to 35% on October 21, [id number], diastolic dysfunction, hyperlipidemia and heart failure with reduced ejection fraction presents to be evaluated postoperatively.  Last saw the patient on April 21, 2020 at that time his EKG was abnormal concerning for acute coronary syndrome versus apical aneurysm.  We therefore sent the patient for echocardiogram which Show abnormal wall motion abnormalities.  He was sent for urgent lab work at [facility] and his troponin was reported to be 17.5 therefore the patient was admitted to [facility] he underwent a left heart catheterization on April 23, 2020 receiving a mid LAD stents for 95% stenosed lesion as well as an RCA stent for 80% lesion.  He was started on dual antiplatelet therapy and is here today for follow-up visit.  He offers no complaints at this time he appears to be doing well since his hospitalization.   Past Medical History:  Diagnosis Date  . Allergy   . CAD (coronary artery disease), native coronary artery 07/24/2019   LHC 07/24/19: DES-pLAD, DES-dRCA  . Cancer ([place]) 04/2017   rectal cancer  . Cataract   . Chronic kidney disease 04/2017   left renal mass  . Chronic systolic heart failure ([place])   . Glaucoma   . Ischemic cardiomyopathy    35-45% on [facility] 07/24/19  . Obesity   . Pseudophakia of right eye 11/2012  . Pulmonary nodules 04/2017  . Thyroid nodule 04/2017   multiple    Past Surgical History:   Procedure Laterality Date  . COLON SURGERY  08/2017  . COLONOSCOPY  04/2017  . CORONARY STENT INTERVENTION N/A 07/24/2019   Procedure: CORONARY STENT INTERVENTION;  Surgeon: Jettie Booze, MD;  Location: [facility];  Service: Cardiovascular;  Laterality: N/A;  . 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 FINGER SURGERY    . IR RADIOLOGIST EVAL & MGMT  06/15/2017  . IR RADIOLOGIST EVAL & MGMT  11/28/2017  . IR RADIOLOGIST EVAL & MGMT  01/10/2018  . IR RADIOLOGIST EVAL & MGMT  04/17/2018  . KIDNEY SURGERY    . LEFT HEART CATH AND CORONARY ANGIOGRAPHY N/A 07/24/2019   Procedure: LEFT HEART CATH AND CORONARY ANGIOGRAPHY;  Surgeon: Jettie Booze, MD;  Location: [facility];  Service: Cardiovascular;  Laterality: N/A;    Current Medications: Current Meds  Medication Sig  . aspirin EC 81 MG tablet Take 1 tablet (81 mg total) by mouth daily.  . Bilberry, Vaccinium myrtillus, (BILBERRY PO) Take 2 tablets by mouth daily.  . Cholecalciferol (VITAMIN D3) 2000 units capsule Take 2,000 Units by mouth at bedtime.   Marland Kitchen latanoprost (XALATAN) 0.005 % ophthalmic solution Place 1 drop into both eyes at bedtime.   Marland Kitchen loperamide (IMODIUM) 2 MG capsule Take 2 mg by mouth as needed for diarrhea or loose  stools.  Marland Kitchen losartan (COZAAR) 25 MG tablet Take 0.5 tablets (12.5 mg total) by mouth daily.  . metoprolol succinate (TOPROL XL) 25 MG 24 hr tablet Take 1 tablet (25 mg total) by mouth daily.  . nitroGLYCERIN (NITROSTAT) 0.4 MG SL tablet Place 1 tablet (0.4 mg total) under the tongue every 5 (five) minutes x 3 doses as needed for chest pain.  . rosuvastatin (CRESTOR) 40 MG tablet Take 1 tablet (40 mg total) by mouth daily at 6 PM.  . ticagrelor (BRILINTA) 90 MG TABS tablet Take 1 tablet (90 mg total) by mouth 2 (two) times daily.  . TURMERIC PO Take 1 tablet by mouth daily.     Allergies:   Influenza vaccines  and Ciprofloxacin   Social History   Socioeconomic History  . Marital status: Married    Spouse name: Not on file  . Number of children: 4  . Years of education: Not on file  . Highest education level: Not on file  Occupational History  . Occupation: Retired   Tobacco Use  . Smoking status: Never Smoker  . Smokeless tobacco: Never Used  Substance and Sexual Activity  . Alcohol use: Not Currently  . Drug use: Never  . Sexual activity: Not on file  Other Topics Concern  . Not on file  Social History Narrative  . Not on file   Social Determinants of Health   Financial Resource Strain:   . Difficulty of Paying Living Expenses: Not on file  Food Insecurity:   . Worried About Charity fundraiser in the Last Year: Not on file  . Ran Out of Food in the Last Year: Not on file  Transportation Needs:   . Lack of Transportation (Medical): Not on file  . Lack of Transportation (Non-Medical): Not on file  Physical Activity:   . Days of Exercise per Week: Not on file  . Minutes of Exercise per Session: Not on file  Stress:   . Feeling of Stress : Not on file  Social Connections:   . Frequency of Communication with Friends and Family: Not on file  . Frequency of Social Gatherings with Friends and Family: Not on file  . Attends Religious Services: Not on file  . Active Member of Clubs or Organizations: Not on file  . Attends Archivist Meetings: Not on file  . Marital Status: Not on file     Family History: The patient's family history includes Colon cancer in his mother; Colon polyps in his mother; Esophageal cancer in his maternal great-grandfather; Rectal cancer in his mother. There is no history of Stomach cancer.  ROS:   Review of Systems  Constitution: Negative for decreased appetite, fever and weight gain.  HENT: Negative for congestion, ear discharge, hoarse voice and sore throat.   Eyes: Negative for discharge, redness, vision loss in right eye and visual  halos.  Cardiovascular: Negative for chest pain, dyspnea on exertion, leg swelling, orthopnea and palpitations.  Respiratory: Negative for cough, hemoptysis, shortness of breath and snoring.   Endocrine: Negative for heat intolerance and polyphagia.  Hematologic/Lymphatic: Negative for bleeding problem. Does not bruise/bleed easily.  Skin: Negative for flushing, nail changes, rash and suspicious lesions.  Musculoskeletal: Negative for arthritis, joint pain, muscle cramps, myalgias, neck pain and stiffness.  Gastrointestinal: Negative for abdominal pain, bowel incontinence, diarrhea and excessive appetite.  Genitourinary: Negative for decreased libido, genital sores and incomplete emptying.  Neurological: Negative for brief paralysis, focal weakness, headaches and loss of balance.  Psychiatric/Behavioral: Negative for altered mental status, depression and suicidal ideas.  Allergic/Immunologic: Negative for HIV exposure and persistent infections.    EKGs/Labs/Other Studies Reviewed:    The following studies were reviewed today:   EKG: None today  Recent Labs: 07/23/2019: TSH 8.277 07/24/2019: ALT 30 07/25/2019: BUN 17; Creatinine, Ser 1.08; Hemoglobin 12.8; Platelets 240; Potassium 3.5; Sodium 137  Recent Lipid Panel    Component Value Date/Time   CHOL 195 07/24/2019 0105   TRIG 194 (H) 07/24/2019 0105   HDL 33 (L) 07/24/2019 0105   CHOLHDL 5.9 07/24/2019 0105   VLDL 39 07/24/2019 0105   LDLCALC 123 (H) 07/24/2019 0105    Physical Exam:    VS:  BP 110/80 (BP Location: Right Arm, Patient Position: Sitting, Cuff Size: Normal)   Pulse 82   Ht 5\' 8"  (1.727 m)   Wt 197 lb (89.4 kg)   SpO2 99%   BMI 29.95 kg/m     Wt Readings from Last 3 Encounters:  08/02/19 197 lb (89.4 kg)  07/25/19 198 lb 10.2 oz (90.1 kg)  07/23/19 204 lb (92.5 kg)     GEN: Well nourished, well developed in no acute distress HEENT: Normal NECK: No JVD; No carotid bruits LYMPHATICS: No  lymphadenopathy CARDIAC: S1S2 noted,RRR, no murmurs, rubs, gallops RESPIRATORY:  Clear to auscultation without rales, wheezing or rhonchi  ABDOMEN: Soft, non-tender, non-distended, +bowel sounds, no guarding. EXTREMITIES: No edema, No cyanosis, no clubbing MUSCULOSKELETAL:  No deformity  SKIN: Warm and dry NEUROLOGIC:  Alert and oriented x 3, non-focal PSYCHIATRIC:  Normal affect, good insight  ASSESSMENT:    1. Ischemic cardiomyopathy   2. Non-ST elevation (NSTEMI) myocardial infarction ([place])   3. Mixed hyperlipidemia   4. Coronary artery disease involving native coronary artery of native heart without angina pectoris   5. Obesity (BMI 30-39.9)    PLAN:    1.  CAD/NSTEMI-he will continue on his DAPT for minimum of 1 year, continue Crestor 40 mg daily and will repeat lipid profile in 3 months to keep his LDL goal less than 70.  Continue Toprol-XL as well.  We will refer the patient for cardiac rehab-he prefers [facility] due to location.  2.  Ischemic cardiomyopathy-EF is 35 to 40%.  He is currently on losartan 12.5 mg daily, Toprol-XL 25 mg daily.  I would like to place the patient on low-dose spironolactone as well as [place] however for now his blood pressure would not be able to tolerate addition of these medications.  I plan to repeat his echocardiogram in 3 months to assess his LV function.   3.  Hyperlipidemia continue patient his Crestor 40 mg daily for now.  4.  Obesity-the patient understands the need to lose weight with diet and exercise. We have discussed specific strategies for this.  The patient is in agreement with the above plan. The patient left the office in stable condition.  The patient will follow up in 3 months.   Medication Adjustments/Labs and Tests Ordered: Current medicines are reviewed at length with the patient today.  Concerns regarding medicines are outlined above.  Orders Placed This Encounter  Procedures  . Lipid Profile  . ECHOCARDIOGRAM  COMPLETE   No orders of the defined types were placed in this encounter.   Patient Instructions  Medication Instructions:  Your physician recommends that you continue on your current medications as directed. Please refer to the Current Medication list given to you today.  *If you need a refill on your cardiac  medications before your next appointment, please call your pharmacy*  Lab Work: Your physician recommends that you return for lab work in: 3 MONTHS Fasting Lipid  If you have labs (blood work) drawn today and your tests are completely normal, you will receive your results only by: Marland Kitchen MyChart Message (if you have MyChart) OR . A paper copy in the mail If you have any lab test that is abnormal or we need to change your treatment, we will call you to review the results.  Testing/Procedures: Your physician has requested that you have an echocardiogram. Echocardiography is a painless test that uses sound waves to create images of your heart. It provides your doctor with information about the size and shape of your heart and how well your heart's chambers and valves are working. This procedure takes approximately one hour. There are no restrictions for this procedure.  IN 3 MONTHS  Follow-Up: At [facility], you and your health needs are our priority.  As part of our continuing mission to provide you with exceptional heart care, we have created designated Provider Care Teams.  These Care Teams include your primary Cardiologist (physician) and Advanced Practice Providers (APPs -  Physician Assistants and Nurse Practitioners) who all work together to provide you with the care you need, when you need it.  Your next appointment:   3 month(s)  The format for your next appointment:   In Person  Provider:   Berniece Salines, DO  Other Instructions You are being referred to Cardiac Rehab at [facility]. They will contact you with date and times of appointments.     Adopting a  Healthy Lifestyle.  Know what a healthy weight is for you (roughly BMI <25) and aim to maintain this   Aim for 7+ servings of fruits and vegetables daily   65-80+ fluid ounces of water or unsweet tea for healthy kidneys   Limit to max 1 drink of alcohol per day; avoid smoking/tobacco   Limit animal fats in diet for cholesterol and heart health - choose grass fed whenever available   Avoid highly processed foods, and foods high in saturated/trans fats   Aim for low stress - take time to unwind and care for your mental health   Aim for 150 min of moderate intensity exercise weekly for heart health, and weights twice weekly for bone health   Aim for 7-9 hours of sleep daily   When it comes to diets, agreement about the perfect plan isnt easy to find, even among the experts. Experts at the [employer] developed an idea known as the Healthy Eating Plate. Just imagine a plate divided into logical, healthy portions.   The emphasis is on diet quality:   Load up on vegetables and fruits - one-half of your plate: Aim for color and variety, and remember that potatoes dont count.   Go for whole grains - one-quarter of your plate: Whole wheat, barley, wheat berries, quinoa, oats, brown rice, and foods made with them. If you want pasta, go with whole wheat pasta.   Protein power - one-quarter of your plate: Fish, chicken, beans, and nuts are all healthy, versatile protein sources. Limit red meat.   The diet, however, does go beyond the plate, offering a few other suggestions.   Use healthy plant oils, such as olive, canola, soy, corn, sunflower and peanut. Check the labels, and avoid partially hydrogenated oil, which have unhealthy trans fats.   If youre thirsty, drink water. Coffee  and tea are good in moderation, but skip sugary drinks and limit milk and dairy products to one or two daily servings.   The type of carbohydrate in the diet is more important than the  amount. Some sources of carbohydrates, such as vegetables, fruits, whole grains, and beans-are healthier than others.   Finally, stay active  Signed, Jonathon Salines, DO  08/02/2019 8:24 AM    [place]

## 2019-08-02 NOTE — Patient Instructions (Signed)
Medication Instructions:  Your physician recommends that you continue on your current medications as directed. Please refer to the Current Medication list given to you today.  *If you need a refill on your cardiac medications before your next appointment, please call your pharmacy*  Lab Work: Your physician recommends that you return for lab work in: 3 MONTHS Fasting Lipid  If you have labs (blood work) drawn today and your tests are completely normal, you will receive your results only by: Marland Kitchen MyChart Message (if you have MyChart) OR . A paper copy in the mail If you have any lab test that is abnormal or we need to change your treatment, we will call you to review the results.  Testing/Procedures: Your physician has requested that you have an echocardiogram. Echocardiography is a painless test that uses sound waves to create images of your heart. It provides your doctor with information about the size and shape of your heart and how well your heart's chambers and valves are working. This procedure takes approximately one hour. There are no restrictions for this procedure.  IN 3 MONTHS  Follow-Up: At [facility], you and your health needs are our priority.  As part of our continuing mission to provide you with exceptional heart care, we have created designated Provider Care Teams.  These Care Teams include your primary Cardiologist (physician) and Advanced Practice Providers (APPs -  Physician Assistants and Nurse Practitioners) who all work together to provide you with the care you need, when you need it.  Your next appointment:   3 month(s)  The format for your next appointment:   In Person  Provider:   Berniece Salines, DO  Other Instructions You are being referred to Cardiac Rehab at [facility]. They will contact you with date and times of appointments.

## 2019-08-07 ENCOUNTER — Telehealth: Payer: Self-pay | Admitting: Cardiology

## 2019-08-07 ENCOUNTER — Other Ambulatory Visit: Payer: Self-pay | Admitting: *Deleted

## 2019-08-07 MED ORDER — TICAGRELOR 90 MG PO TABS: 90.0000 mg | ORAL_TABLET | Freq: Two times a day (BID) | ORAL | 1 refills | Status: DC

## 2019-08-07 NOTE — Telephone Encounter (Signed)
Refill sent to [facility]

## 2019-08-07 NOTE — Telephone Encounter (Signed)
Call Brilita in to the [facility] please

## 2019-08-20 ENCOUNTER — Ambulatory Visit: Payer: Medicare PPO | Admitting: Cardiology

## 2019-08-23 ENCOUNTER — Telehealth: Payer: Self-pay | Admitting: Cardiology

## 2019-08-23 NOTE — Telephone Encounter (Signed)
New Message   Patient is calling because he is not sure what medication is causing him to feel very fatigue. He also stated that he cut himself a few days ago and it was hard to control the bleeding. He fell today and hurt his head. He thinks it may be the Brilinta.

## 2019-08-23 NOTE — Telephone Encounter (Signed)
Spoke with patient. Patient reports waking up "feeling drunk" since he had medication changes. He reports he's been fatigued. Patient reports he fell this morning and hit his head on his footboard of the bed. Patient reports he received a small cut but that it wouldn't stop bleeding. Patient reports he finally had it under control but when he got to rehab it was bleeding again. Patient reports the rehab nurse bandaged it for him and advised him to see a doctor if it continues to bleed. At this time patient is not having any symptoms, no headache, confusion or dizziness. Patient advised to go to urgent care or the ER if he hits his head again or if he develops symptoms. Patient would like to see Dr. Harriet Masson to discuss his medications and see if any of them can be decreased. Patient scheduled for 3:55pm on Monday. Will route to MD for review.

## 2019-08-26 ENCOUNTER — Ambulatory Visit: Payer: Medicare PPO | Admitting: Cardiology

## 2019-08-26 ENCOUNTER — Other Ambulatory Visit: Payer: Self-pay

## 2019-08-26 NOTE — Telephone Encounter (Signed)
Patient has OV 2/22 with Dr. Harriet Masson

## 2019-09-19 ENCOUNTER — Ambulatory Visit (INDEPENDENT_AMBULATORY_CARE_PROVIDER_SITE_OTHER): Payer: Medicare PPO

## 2019-09-19 ENCOUNTER — Other Ambulatory Visit: Payer: Self-pay

## 2019-09-19 DIAGNOSIS — I255 Ischemic cardiomyopathy: Secondary | ICD-10-CM

## 2019-09-19 NOTE — Progress Notes (Signed)
Complete echocardiogram has been performed.  Jimmy Analucia Hush RDCS, RVT 

## 2019-09-20 ENCOUNTER — Telehealth: Payer: Self-pay | Admitting: Cardiology

## 2019-09-20 NOTE — Telephone Encounter (Signed)
I spoke to the patient who called to ask about his Metoprolol Succinate.  He was taking 25 mg daily and then when he picked up his most recent prescription, the bottle indicates that he should take 1/2 tablet 12.5 mg Daily.    His medication list indicates 25 mg Daily and that is what he is still taking.  His BP has dropped to 104/60 and today 98/70.  He feels dizzy lately.  I told him to follow bottle and take 12.5 mg Daily until he hears from Dr Harriet Masson with further advisement.  He will do so.

## 2019-09-20 NOTE — Telephone Encounter (Signed)
New Message:   Pt says he need to know how is he supposed to be taking his Metoprolol? He says his blood pressure have been running low.

## 2019-09-20 NOTE — Telephone Encounter (Signed)
Okay to take metoprolol succinate 12.5 mg daily.  Please have him see me sooner than his next appointment.

## 2019-09-23 NOTE — Telephone Encounter (Signed)
Left message for patient to return call.

## 2019-09-24 ENCOUNTER — Telehealth: Payer: Self-pay

## 2019-09-24 NOTE — Telephone Encounter (Signed)
The patient has been notified of the Echo result and verbalized understanding.  All questions (if any) were answered. Frederik Schmidt, RN 09/24/2019 1:30 PM

## 2019-09-24 NOTE — Telephone Encounter (Signed)
Lpm and mailed results 3/23

## 2019-09-24 NOTE — Telephone Encounter (Signed)
-----   Message from Berniece Salines, DO sent at 09/19/2019  8:17 PM EDT ----- Let patient know that his EF is improved compared to back in January.  He should continue on his current medical management.  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  I will discuss with him more details at his next visit.

## 2019-09-24 NOTE — Telephone Encounter (Signed)
Follow up  ° ° °Patient is returning call.  °

## 2019-09-27 NOTE — Telephone Encounter (Signed)
Patient states that he had blood in his stool last night. He states that some of it was bright and some was dark. Patient is not having any other symptoms. He states that he is going to keep monitoring it and he will also follow up with his oncologist.

## 2019-09-27 NOTE — Telephone Encounter (Signed)
Left message for patient to return call.

## 2019-09-27 NOTE — Telephone Encounter (Signed)
Patient is returning phone call in regards to Dr. Harriet Masson wanting to see him sooner. His appointment has been rescheduled for Dr. Terrial Rhodes her first available on 10/14/19. Jonathon Oneill also states he passed blood in his urine lastnight 09/26/19 and he thinks it is due to the blood thinner. Please advise.

## 2019-10-01 NOTE — Telephone Encounter (Signed)
Left message for patient to return call.

## 2019-10-01 NOTE — Telephone Encounter (Signed)
[name] please have him stop taking aspirin.  I am concerned with him having both urinary and rectal bleeding and I think he should see Dr. Elissa Hefty in his office he is really good at working patient into his schedule and needs a CBC performed.

## 2019-10-01 NOTE — Telephone Encounter (Signed)
Lm to call back ./cy 

## 2019-10-02 ENCOUNTER — Telehealth: Payer: Self-pay | Admitting: Cardiology

## 2019-10-02 NOTE — Telephone Encounter (Signed)
Spoke with pt he was returning call from yesterday. Pt phone disconnected as I was reviewing plan. Pt stated he is frustrated he has to call into call center ad not just the providers office to get someone. He stated he is not having the issues of blood in stool but weill stop ASA 81 until his f/u appt with Dr. Harriet Masson on 4/12. Pt stated he has been taking his medications with food and thinks that was the issue causing the bleeding. He does not think he needs to see his PCP now that all is better and he going to see Dr. Harriet Masson in few weeks. He knows to call us back if have any issues.

## 2019-10-02 NOTE — Telephone Encounter (Signed)
Patient was returning a call from our office. He received a message yesterday that just said  to call him back. No other documentation had been made of an attempted call

## 2019-10-02 NOTE — Telephone Encounter (Signed)
See note on 3/30

## 2019-10-14 ENCOUNTER — Other Ambulatory Visit: Payer: Self-pay

## 2019-10-14 ENCOUNTER — Encounter: Payer: Self-pay | Admitting: Cardiology

## 2019-10-14 ENCOUNTER — Ambulatory Visit: Payer: Medicare PPO | Admitting: Cardiology

## 2019-10-14 VITALS — BP 102/66 | HR 92 | Ht 68.0 in | Wt 180.0 lb

## 2019-10-14 DIAGNOSIS — I712 Thoracic aortic aneurysm, without rupture, unspecified: Secondary | ICD-10-CM

## 2019-10-14 DIAGNOSIS — I255 Ischemic cardiomyopathy: Secondary | ICD-10-CM | POA: Diagnosis not present

## 2019-10-14 DIAGNOSIS — E782 Mixed hyperlipidemia: Secondary | ICD-10-CM | POA: Diagnosis not present

## 2019-10-14 DIAGNOSIS — I10 Essential (primary) hypertension: Secondary | ICD-10-CM

## 2019-10-14 DIAGNOSIS — I2510 Atherosclerotic heart disease of native coronary artery without angina pectoris: Secondary | ICD-10-CM | POA: Diagnosis not present

## 2019-10-14 DIAGNOSIS — R42 Dizziness and giddiness: Secondary | ICD-10-CM

## 2019-10-14 LAB — COMPREHENSIVE METABOLIC PANEL
ALT: 33 IU/L (ref 0–44)
AST: 29 IU/L (ref 0–40)
Albumin/Globulin Ratio: 2 (ref 1.2–2.2)
Albumin: 4.3 g/dL (ref 3.7–4.7)
Alkaline Phosphatase: 56 IU/L (ref 39–117)
BUN/Creatinine Ratio: 12 (ref 10–24)
BUN: 11 mg/dL (ref 8–27)
Bilirubin Total: 0.7 mg/dL (ref 0.0–1.2)
CO2: 22 mmol/L (ref 20–29)
Calcium: 9 mg/dL (ref 8.6–10.2)
Chloride: 105 mmol/L (ref 96–106)
Creatinine, Ser: 0.95 mg/dL (ref 0.76–1.27)
GFR calc Af Amer: 92 mL/min/{1.73_m2} (ref >59)
GFR calc non Af Amer: 80 mL/min/{1.73_m2} (ref >59)
Globulin, Total: 2.1 g/dL (ref 1.5–4.5)
Glucose: 95 mg/dL (ref 65–99)
Potassium: 4 mmol/L (ref 3.5–5.2)
Sodium: 140 mmol/L (ref 134–144)
Total Protein: 6.4 g/dL (ref 6.0–8.5)

## 2019-10-14 LAB — LIPID PANEL
Chol/HDL Ratio: 2 ratio (ref 0.0–5.0)
Cholesterol, Total: 85 mg/dL — ABNORMAL LOW (ref 100–199)
HDL: 43 mg/dL (ref >39)
LDL Chol Calc (NIH): 24 mg/dL (ref 0–99)
Triglycerides: 92 mg/dL (ref 0–149)
VLDL Cholesterol Cal: 18 mg/dL (ref 5–40)

## 2019-10-14 LAB — MAGNESIUM: Magnesium: 2.2 mg/dL (ref 1.6–2.3)

## 2019-10-14 NOTE — Patient Instructions (Signed)
Medication Instructions:   STOP TAKING :   1. METOPROLOL  12.5 MG  ONCE A DAY   2. LOSARTAN  12.5 MG  ONCE A DAY    *If you need a refill on your cardiac medications before your next appointment, please call your pharmacy*   Lab Work:  [place]   If you have labs (blood work) drawn today and your tests are completely normal, you will receive your results only by: Marland Kitchen MyChart Message (if you have MyChart) OR . A paper copy in the mail If you have any lab test that is abnormal or we need to change your treatment, we will call you to review the results.   Testing/Procedures: NONE ORDERED  TODAY   Follow-Up: At [facility], you and your health needs are our priority.  As part of our continuing mission to provide you with exceptional heart care, we have created designated Provider Care Teams.  These Care Teams include your primary Cardiologist (physician) and Advanced Practice Providers (APPs -  Physician Assistants and Nurse Practitioners) who all work together to provide you with the care you need, when you need it.  We recommend signing up for the patient portal called "MyChart".  Sign up information is provided on this After Visit Summary.  MyChart is used to connect with patients for Virtual Visits (Telemedicine).  Patients are able to view lab/test results, encounter notes, upcoming appointments, etc.  Non-urgent messages can be sent to your provider as well.   To learn more about what you can do with MyChart, go to [email or web address].    Your next appointment:    1 month(s)  The format for your next appointment:   In Person  Provider:   You will see Berniece Salines, DO.  Or, you can be scheduled with the following Advanced Practice Provider on your designated Care Team (at our [facility]):  Laurann Montana, FNP     Other Instructions

## 2019-10-14 NOTE — Progress Notes (Signed)
Cardiology Office Note:    Date:  10/14/2019   ID:  BURNEY CALZADILLA, DOB May 29, 1946, MRN [id number]  PCP:  Nicoletta Dress, MD  Cardiologist:  Berniece Salines, DO  Electrophysiologist:  None   Referring MD: Nicoletta Dress, MD   Follow up visit  For CAD  History of Present Illness:    Jonathon Oneill is a 74 y.o. male with a hx of coronary artery disease status post stent to his LAD and RCA on April 22, 2020, ischemic cardiomyopathy EF 30 to 35% on April 24, 6383, diastolic dysfunction, hyperlipidemia and heart failure with reduced ejection fraction.   I last saw the patient on 08/02/2019 at that time he he was continue on his DAPT ( Aspirin 33m daily and Brillinta 90 mg BID.), statin, beta blocker and his ARB.   He was also referred to cardiac rehab.   Today he is her for a follow up visit. He tells me that he has been doing well with the rehab. He tells me that his blood pressure at home as been running with systolic's in the low [id number] He also notes that he has been dizzy most of the time now with his lower blood pressure.  He denies shortness of breath and chest pain.   Past Medical History:  Diagnosis Date  . Abdominal pain, acute, generalized 04/05/2017   Overview:  Added automatically from request for surgery [id number] Formatting of this note might be different from the original. Added automatically from request for surgery [id number] . Abnormal CT of the abdomen 04/05/2017   Overview:  Added automatically from request for surgery [id number] Formatting of this note might be different from the original. Added automatically from request for surgery [phone] . Allergy   . CAD (coronary artery disease), native coronary artery 07/24/2019   LHC 07/24/19: DES-pLAD, DES-dRCA  . Cancer ([facility] 04/2017   rectal cancer  . Cataract   . Chronic kidney disease 04/2017   left renal mass  . Chronic systolic heart failure ([facility]   . Encounter for antineoplastic chemotherapy 05/03/2017  .  Epiretinal membrane (ERM) of left eye 05/09/2013  . Glaucoma   . [facility]    [facility]  . Lamellar macular hole, left eye 05/09/2013  . Left renal mass 04/05/2017  . Mass of left thigh 03/25/2019  . Mixed hyperlipidemia 08/02/2019  . Multiple thyroid nodules 06/02/2017  . Non-ST elevation (NSTEMI) myocardial infarction [place] 07/23/2019  . Nontoxic single thyroid nodule 06/14/2017  . Nuclear sclerotic cataract of left eye 01/24/2013  . Obesity   . Obesity, unspecified 05/21/2012  . Primary open-angle glaucoma 04/16/2012  . Pseudophakia 09/13/2012   Overview:  S/p YAG CAPS OD ~ 11/2012  Formatting of this note might be different from the original. Overview:  S/p YAG CAPS OD ~ 11/2012 Overview:  Overview:  S/p YAG CAPS OD ~ 11/2012  . Pseudophakia of right eye 11/2012  . Pulmonary nodules 04/2017  . [place]al carcinoma ([place] 04/18/2017  . Retinal break of left eye 04/30/2012  . Thyroid nodule 04/2017   multiple    Past Surgical History:  Procedure Laterality Date  . COLON SURGERY  08/2017  . COLONOSCOPY  04/2017  . CORONARY STENT INTERVENTION N/A 07/24/2019   Procedure: CORONARY STENT INTERVENTION;  Surgeon: VJettie Booze MD;  [facility];  Service: Cardiovascular;  Laterality: N/A;  . EYE SURGERY     POAG; lamellar macular hole on left; retinal  break of left eye; nuclear sclerotic cataract left eye  . FINGER SURGERY    . IR RADIOLOGIST EVAL & MGMT  06/15/2017  . IR RADIOLOGIST EVAL & MGMT  11/28/2017  . IR RADIOLOGIST EVAL & MGMT  01/10/2018  . IR RADIOLOGIST EVAL & MGMT  04/17/2018  . KIDNEY SURGERY    . LEFT HEART CATH AND CORONARY ANGIOGRAPHY N/A 07/24/2019   Procedure: LEFT HEART CATH AND CORONARY ANGIOGRAPHY;  Surgeon: Jettie Booze, MD;  Location: [facility];  Service: Cardiovascular;  Laterality: N/A;    Current Medications: Current Meds  Medication Sig  . aspirin EC 81 MG tablet Take 1 tablet (81 mg total) by mouth  daily.  . Bilberry, Vaccinium myrtillus, (BILBERRY PO) Take 2 tablets by mouth daily.  . Cholecalciferol (VITAMIN D3) 2000 units capsule Take 2,000 Units by mouth at bedtime.   Marland Kitchen latanoprost (XALATAN) 0.005 % ophthalmic solution Place 1 drop into both eyes at bedtime.   Marland Kitchen loperamide (IMODIUM) 2 MG capsule Take 2 mg by mouth as needed for diarrhea or loose stools.  . nitroGLYCERIN (NITROSTAT) 0.4 MG SL tablet Place 1 tablet (0.4 mg total) under the tongue every 5 (five) minutes x 3 doses as needed for chest pain.  . rosuvastatin (CRESTOR) 40 MG tablet Take 1 tablet (40 mg total) by mouth daily at 6 PM.  . ticagrelor (BRILINTA) 90 MG TABS tablet Take 1 tablet (90 mg total) by mouth 2 (two) times daily.  . TURMERIC PO Take 1 tablet by mouth daily.  . [DISCONTINUED] losartan (COZAAR) 25 MG tablet Take 0.5 tablets (12.5 mg total) by mouth daily.  . [DISCONTINUED] metoprolol succinate (TOPROL XL) 25 MG 24 hr tablet Take 1 tablet (25 mg total) by mouth daily. (Patient taking differently: Take 12.5 mg by mouth daily. )     Allergies:   Influenza vaccines and Ciprofloxacin   Social History   Socioeconomic History  . Marital status: Married    Spouse name: Not on file  . Number of children: 4  . Years of education: Not on file  . Highest education level: Not on file  Occupational History  . Occupation: Retired   Tobacco Use  . Smoking status: Never Smoker  . Smokeless tobacco: Never Used  Substance and Sexual Activity  . Alcohol use: Not Currently  . Drug use: Never  . Sexual activity: Not on file  Other Topics Concern  . Not on file  Social History Narrative  . Not on file   Social Determinants of Health   Financial Resource Strain:   . Difficulty of Paying Living Expenses:   Food Insecurity:   . Worried About Charity fundraiser in the Last Year:   . Arboriculturist in the Last Year:   Transportation Needs:   . Film/video editor (Medical):   Marland Kitchen Lack of Transportation  (Non-Medical):   Physical Activity:   . Days of Exercise per Week:   . Minutes of Exercise per Session:   Stress:   . Feeling of Stress :   Social Connections:   . Frequency of Communication with Friends and Family:   . Frequency of Social Gatherings with Friends and Family:   . Attends Religious Services:   . Active Member of Clubs or Organizations:   . Attends Archivist Meetings:   Marland Kitchen Marital Status:      Family History: The patient's family history includes Colon cancer in his mother; Colon polyps in his  mother; Esophageal cancer in his maternal great-grandfather; Rectal cancer in his mother. There is no history of Stomach cancer.  ROS:   Review of Systems  Constitution: Negative for decreased appetite, fever and weight gain.  HENT: Negative for congestion, ear discharge, hoarse voice and sore throat.   Eyes: Negative for discharge, redness, vision loss in right eye and visual halos.  Cardiovascular: Negative for chest pain, dyspnea on exertion, leg swelling, orthopnea and palpitations.  Respiratory: Negative for cough, hemoptysis, shortness of breath and snoring.   Endocrine: Negative for heat intolerance and polyphagia.  Hematologic/Lymphatic: Negative for bleeding problem. Does not bruise/bleed easily.  Skin: Negative for flushing, nail changes, rash and suspicious lesions.  Musculoskeletal: Negative for arthritis, joint pain, muscle cramps, myalgias, neck pain and stiffness.  Gastrointestinal: Negative for abdominal pain, bowel incontinence, diarrhea and excessive appetite.  Genitourinary: Negative for decreased libido, genital sores and incomplete emptying.  Neurological: Negative for brief paralysis, focal weakness, headaches and loss of balance.  Psychiatric/Behavioral: Negative for altered mental status, depression and suicidal ideas.  Allergic/Immunologic: Negative for HIV exposure and persistent infections.    EKGs/Labs/Other Studies Reviewed:    The  following studies were reviewed today:   EKG:  None today  TTE IMPRESSIONS 09/19/2019 1. There is akinesis of the apical lateral, entire apex and the apical  septum with mild hypokinesis in the remaining myocardial wall segments.  Left ventricular ejection fraction, by estimation, is 45 to 50%. The left  ventricle has mildly decreased  function. The left ventricle demonstrates regional wall motion  abnormalities with akinesis of the apical lateral, entire apex and the  apical septum with mild hypokinesis in the remaining myocardial wall  segments. There is mild concentric left ventricular  hypertrophy.  2. Left ventricular diastolic parameters are consistent with Grade I diastolic dysfunction (impaired relaxation).  3. Right ventricular systolic function is normal. The right ventricular size is normal. There is normal pulmonary artery systolic pressure.  4. The mitral valve is normal in structure. No evidence of mitral valve regurgitation. No evidence of mitral stenosis.  5. The aortic valve is normal in structure. Aortic valve regurgitation is not visualized. No aortic stenosis is present.  6. Aneurysm of the ascending aorta, measuring 42 mm.  7. The inferior vena cava is normal in size with greater than 50%  respiratory variability, suggesting right atrial pressure of 3 mmHg.  8. Compared to prior study done in 07/2019, EF has improved, now 45-50% was 35-40%. The proximal ascending aorta is now 4.2 cm, was 4.0 cm.   Recent Labs: 07/23/2019: TSH 8.277 07/25/2019: Hemoglobin 12.8; Platelets 240 10/14/2019: ALT 33; BUN 11; Creatinine, Ser 0.95; Magnesium 2.2; Potassium 4.0; Sodium 140  Recent Lipid Panel    Component Value Date/Time   CHOL 85 (L) 10/14/2019 0000   TRIG 92 10/14/2019 0000   HDL 43 10/14/2019 0000   CHOLHDL 2.0 10/14/2019 0000   CHOLHDL 5.9 07/24/2019 0105   VLDL 39 07/24/2019 0105   LDLCALC 24 10/14/2019 0000    Physical Exam:    VS:  BP 102/66   Pulse 92    Ht '5\' 8"'  (1.727 m)   Wt 180 lb (81.6 kg)   BMI 27.37 kg/m     Wt Readings from Last 3 Encounters:  10/14/19 180 lb (81.6 kg)  08/02/19 197 lb (89.4 kg)  07/25/19 198 lb 10.2 oz (90.1 kg)     GEN: Well nourished, well developed in no acute distress HEENT: Normal NECK: No JVD; No carotid  bruits LYMPHATICS: No lymphadenopathy CARDIAC: S1S2 noted,RRR, no murmurs, rubs, gallops RESPIRATORY:  Clear to auscultation without rales, wheezing or rhonchi  ABDOMEN: Soft, non-tender, non-distended, +bowel sounds, no guarding. EXTREMITIES: No edema, No cyanosis, no clubbing MUSCULOSKELETAL:  No deformity  SKIN: Warm and dry NEUROLOGIC:  Alert and oriented x 3, non-focal PSYCHIATRIC:  Normal affect, good insight  ASSESSMENT:    1. Coronary artery disease involving native coronary artery of native heart without angina pectoris   2. Hypertension, unspecified type   3. Mixed hyperlipidemia   4. Ischemic cardiomyopathy   5. Thoracic aortic aneurysm without rupture ([place])   6. Dizziness    PLAN:     1. CAD- stable. No angina symptoms. Continue the DAPT ( Aspirin and Brillinta), continue Crestor 40 mg daily.   2. Dizziness - in the setting of low systolic blood pressure. His systolic blood pressure manually is low today. I will stop the toprol xl and losartan for now.I am hoping his symptoms will resolve with this.   3. Ischemic cardiomyopathy - his repeat TTE showed improvement. Recent EF 45-50% which is an improvement from 35-40%. We will continue to monitor.  4. Aortic Aneurysm - I discuss cta to confirm this. He is planning imaging with his oncologist. I am hoping for [place] to get this along with the CTA that is being planned.  5. Blood work will be done today which will include cmp, mag, and lipid profile  6. Hyperlipidemia - continue patient on statin dose. Will review lipid profile and further recommendation will be made if needed.  7. Hypertension - beta blocker and Arb stopped due  to low systolic blood pressure.  The patient is in agreement with the above plan. The patient left the office in stable condition.  The patient will follow up in 1 month- due to medication change.   Medication Adjustments/Labs and Tests Ordered: Current medicines are reviewed at length with the patient today.  Concerns regarding medicines are outlined above.  Orders Placed This Encounter  Procedures  . Magnesium  . Lipid panel  . Comp Met (CMET)   No orders of the defined types were placed in this encounter.   Patient Instructions  Medication Instructions:   STOP TAKING :   1. METOPROLOL  12.5 MG  ONCE A DAY   2. LOSARTAN  12.5 MG  ONCE A DAY    *If you need a refill on your cardiac medications before your next appointment, please call your pharmacy*   Lab Work:  [place]   If you have labs (blood work) drawn today and your tests are completely normal, you will receive your results only by: Marland Kitchen MyChart Message (if you have MyChart) OR . A paper copy in the mail If you have any lab test that is abnormal or we need to change your treatment, we will call you to review the results.   Testing/Procedures: NONE ORDERED  TODAY   Follow-Up: At [facility], you and your health needs are our priority.  As part of our continuing mission to provide you with exceptional heart care, we have created designated Provider Care Teams.  These Care Teams include your primary Cardiologist (physician) and Advanced Practice Providers (APPs -  Physician Assistants and Nurse Practitioners) who all work together to provide you with the care you need, when you need it.  We recommend signing up for the patient portal called "MyChart".  Sign up information is provided on this After Visit Summary.  MyChart is used to connect with patients for Virtual Visits (Telemedicine).  Patients are able to view lab/test results, encounter notes, upcoming appointments, etc.  Non-urgent messages can be  sent to your provider as well.   To learn more about what you can do with MyChart, go to [email or web address].    Your next appointment:    1 month(s)  The format for your next appointment:   In Person  Provider:   You will see Berniece Salines, DO.  Or, you can be scheduled with the following Advanced Practice Provider on your designated Care Team (at our [facility]):  Laurann Montana, FNP     Other Instructions      Adopting a Healthy Lifestyle.  Know what a healthy weight is for you (roughly BMI <25) and aim to maintain this   Aim for 7+ servings of fruits and vegetables daily   65-80+ fluid ounces of water or unsweet tea for healthy kidneys   Limit to max 1 drink of alcohol per day; avoid smoking/tobacco   Limit animal fats in diet for cholesterol and heart health - choose grass fed whenever available   Avoid highly processed foods, and foods high in saturated/trans fats   Aim for low stress - take time to unwind and care for your mental health   Aim for 150 min of moderate intensity exercise weekly for heart health, and weights twice weekly for bone health   Aim for 7-9 hours of sleep daily   When it comes to diets, agreement about the perfect plan isnt easy to find, even among the experts. Experts at the [employer] developed an idea known as the Healthy Eating Plate. Just imagine a plate divided into logical, healthy portions.   The emphasis is on diet quality:   Load up on vegetables and fruits - one-half of your plate: Aim for color and variety, and remember that potatoes dont count.   Go for whole grains - one-quarter of your plate: Whole wheat, barley, wheat berries, quinoa, oats, brown rice, and foods made with them. If you want pasta, go with whole wheat pasta.   Protein power - one-quarter of your plate: Fish, chicken, beans, and nuts are all healthy, versatile protein sources. Limit red meat.   The diet, however, does go  beyond the plate, offering a few other suggestions.   Use healthy plant oils, such as olive, canola, soy, corn, sunflower and peanut. Check the labels, and avoid partially hydrogenated oil, which have unhealthy trans fats.   If youre thirsty, drink water. Coffee and tea are good in moderation, but skip sugary drinks and limit milk and dairy products to one or two daily servings.   The type of carbohydrate in the diet is more important than the amount. Some sources of carbohydrates, such as vegetables, fruits, whole grains, and beans-are healthier than others.   Finally, stay active  Signed, Berniece Salines, DO  10/14/2019 8:31 PM    [facility]

## 2019-10-16 LAB — COMPREHENSIVE METABOLIC PANEL

## 2019-10-16 LAB — LIPID PANEL

## 2019-10-16 LAB — MAGNESIUM

## 2019-10-17 ENCOUNTER — Telehealth: Payer: Self-pay

## 2019-10-17 LAB — SPECIMEN STATUS REPORT

## 2019-10-17 LAB — MAGNESIUM: Magnesium: 2.3 mg/dL (ref 1.6–2.3)

## 2019-10-17 NOTE — Telephone Encounter (Signed)
-----   Message from Berniece Salines, DO sent at 10/17/2019  8:35 AM EDT ----- Magnesium normal

## 2019-10-17 NOTE — Telephone Encounter (Signed)
Spoke with patient regarding results.  Patient verbalizes understanding and is agreeable to plan of care. Advised patient to call back with any issues or concerns.  

## 2019-10-21 ENCOUNTER — Telehealth: Payer: Self-pay | Admitting: Cardiology

## 2019-10-21 NOTE — Telephone Encounter (Signed)
LMTCB

## 2019-10-21 NOTE — Telephone Encounter (Signed)
Pt c/o medication issue:  1. Name of Medication: rosuvastatin (CRESTOR) 40 MG tablet  2. How are you currently taking this medication (dosage and times per day)? Has not taken today   3. Are you having a reaction (difficulty breathing--STAT)? No   4. What is your medication issue? Coden is calling stating when he went to go pick up this medication from the pharmacy they gave him 10 MG instead of 40 MG. He states he did not open the package and immediately called the pharmacy to verify that was the correct dosage they meant to give him and he stated they have never filled a 40 MG rosuvastatin at that pharmacy. Matej states he'd prefer to take the 10 MG instead of 40 MG if that is okay with Dr. Harriet Masson. Please advise.

## 2019-10-22 MED ORDER — ROSUVASTATIN CALCIUM 40 MG PO TABS: 40.0000 mg | ORAL_TABLET | Freq: Every day | ORAL | 3 refills | Status: DC

## 2019-10-22 NOTE — Telephone Encounter (Signed)
Left message for patient to call back. Patient was started on Crestor 40 after his heart cath in January.

## 2019-10-22 NOTE — Telephone Encounter (Signed)
Patient called back. He is suppose to be on Rosuvastatin 40 mg by mouth daily. Will send medications to patient's local pharmacy.

## 2019-10-23 ENCOUNTER — Ambulatory Visit: Payer: Medicare PPO | Admitting: Cardiology

## 2019-10-25 ENCOUNTER — Ambulatory Visit: Payer: Medicare PPO | Admitting: Cardiology

## 2019-11-12 ENCOUNTER — Other Ambulatory Visit: Payer: Self-pay

## 2019-11-13 ENCOUNTER — Encounter: Payer: Self-pay | Admitting: Cardiology

## 2019-11-13 ENCOUNTER — Other Ambulatory Visit: Payer: Self-pay

## 2019-11-13 ENCOUNTER — Ambulatory Visit: Payer: Medicare PPO | Admitting: Cardiology

## 2019-11-13 VITALS — BP 110/76 | HR 68 | Ht 68.0 in | Wt 179.0 lb

## 2019-11-13 DIAGNOSIS — I255 Ischemic cardiomyopathy: Secondary | ICD-10-CM | POA: Diagnosis not present

## 2019-11-13 DIAGNOSIS — I2510 Atherosclerotic heart disease of native coronary artery without angina pectoris: Secondary | ICD-10-CM

## 2019-11-13 DIAGNOSIS — I712 Thoracic aortic aneurysm, without rupture, unspecified: Secondary | ICD-10-CM

## 2019-11-13 DIAGNOSIS — E782 Mixed hyperlipidemia: Secondary | ICD-10-CM

## 2019-11-13 MED ORDER — ROSUVASTATIN CALCIUM 20 MG PO TABS: 20.0000 mg | ORAL_TABLET | Freq: Every day | ORAL | 3 refills | Status: DC

## 2019-11-13 NOTE — Progress Notes (Signed)
Cardiology Office Note:    Date:  11/13/2019   ID:  Jonathon Oneill, DOB [birth year], MRN [id number]  PCP:  Jonathon Dress, MD  Cardiologist:  Jonathon Salines, DO  Electrophysiologist:  None   Referring MD: Jonathon Dress, MD   Chief Complaint  Patient presents with  . Follow-up   The patient present for a follow up for CAD.    History of Present Illness:    Jonathon Oneill is a 74 y.o. male with a hx of CAD status post stent to his LAD and RCA placed in January 20,2021 on dual antiplatelet therapy, hyperlipidemia, ischemic cardiomyopathy his EF has improved slightly on his recent echo from 45-50% from 35-40% in 07/2019 and proximal ascending aorta location.   Last saw the patient October 14, 2019 at that time.  At that time we stopped his metoprolol succinate as well as his losartan due to hypotension.  Today he tells me that the dizziness had resolved since stopping these medications.  He is inquiring about decreasing his Crestor dose.  Past Medical History:  Diagnosis Date  . Abdominal pain, acute, generalized 04/05/2017   Overview:  Added automatically from request for surgery [id number]  Formatting of this note might be different from the original. Added automatically from request for surgery [id number]  . Abnormal CT of the abdomen 04/05/2017   Overview:  Added automatically from request for surgery [id number]  Formatting of this note might be different from the original. Added automatically from request for surgery [id number]  . Allergy   . CAD (coronary artery disease), native coronary artery 07/24/2019   LHC 07/24/19: DES-pLAD, DES-dRCA  . Cancer ([place]) 04/2017   rectal cancer  . Cataract   . Chronic kidney disease 04/2017   left renal mass  . Chronic systolic heart failure ([place])   . Encounter for antineoplastic chemotherapy 05/03/2017  . Epiretinal membrane (ERM) of left eye 05/09/2013  . Glaucoma   . Ischemic cardiomyopathy    35-45% on [facility] 07/24/19  . Lamellar macular hole, left  eye 05/09/2013  . Left renal mass 04/05/2017  . Mass of left thigh 03/25/2019  . Mixed hyperlipidemia 08/02/2019  . Multiple thyroid nodules 06/02/2017  . Non-ST elevation (NSTEMI) myocardial infarction ([place]) 07/23/2019  . Nontoxic single thyroid nodule 06/14/2017  . Nuclear sclerotic cataract of left eye 01/24/2013  . Obesity   . Obesity, unspecified 05/21/2012  . Primary open-angle glaucoma 04/16/2012  . Pseudophakia 09/13/2012   Overview:  S/p YAG CAPS OD ~ 11/2012  Formatting of this note might be different from the original. Overview:  S/p YAG CAPS OD ~ 11/2012 Overview:  Overview:  S/p YAG CAPS OD ~ 11/2012  . Pseudophakia of right eye 11/2012  . Pulmonary nodules 04/2017  . Rectal carcinoma ([place]) 04/18/2017  . Retinal break of left eye 04/30/2012  . Thyroid nodule 04/2017   multiple    Past Surgical History:  Procedure Laterality Date  . COLON SURGERY  08/2017  . COLONOSCOPY  04/2017  . CORONARY STENT INTERVENTION N/A 07/24/2019   Procedure: CORONARY STENT INTERVENTION;  Surgeon: Jonathon Booze, MD;  Location: [facility];  Service: Cardiovascular;  Laterality: N/A;  . 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 FINGER SURGERY    . IR RADIOLOGIST EVAL & MGMT  06/15/2017  . IR RADIOLOGIST EVAL & MGMT  11/28/2017  . IR RADIOLOGIST EVAL &  MGMT  01/10/2018  . IR RADIOLOGIST EVAL & MGMT  04/17/2018  . KIDNEY SURGERY    . LEFT HEART CATH AND CORONARY ANGIOGRAPHY N/A 07/24/2019   Procedure: LEFT HEART CATH AND CORONARY ANGIOGRAPHY;  Surgeon: Jonathon Booze, MD;  Location: [facility];  Service: Cardiovascular;  Laterality: N/A;    Current Medications: Current Meds  Medication Sig  . aspirin EC 81 MG tablet Take 1 tablet (81 mg total) by mouth daily.  . Cholecalciferol (VITAMIN D3) 2000 units capsule Take 2,000 Units by mouth at bedtime.   . dorzolamide-timolol (COSOPT) 22.3-6.8 MG/ML  ophthalmic solution   . latanoprost (XALATAN) 0.005 % ophthalmic solution Place 1 drop into both eyes at bedtime.   Marland Kitchen loperamide (IMODIUM) 2 MG capsule Take 2 mg by mouth as needed for diarrhea or loose stools.  . nitroGLYCERIN (NITROSTAT) 0.4 MG SL tablet Place 1 tablet (0.4 mg total) under the tongue every 5 (five) minutes x 3 doses as needed for chest pain.  . ticagrelor (BRILINTA) 90 MG TABS tablet Take 1 tablet (90 mg total) by mouth 2 (two) times daily.  . TURMERIC PO Take 1 tablet by mouth daily.  . [DISCONTINUED] rosuvastatin (CRESTOR) 10 MG tablet Take 10 mg by mouth daily.  . [DISCONTINUED] rosuvastatin (CRESTOR) 40 MG tablet Take 1 tablet (40 mg total) by mouth daily at 6 PM.     Allergies:   Influenza vaccines and Ciprofloxacin   Social History   Socioeconomic History  . Marital status: Married    Spouse name: Not on file  . Number of children: 4  . Years of education: Not on file  . Highest education level: Not on file  Occupational History  . Occupation: Retired   Tobacco Use  . Smoking status: Never Smoker  . Smokeless tobacco: Never Used  Substance and Sexual Activity  . Alcohol use: Not Currently  . Drug use: Never  . Sexual activity: Not on file  Other Topics Concern  . Not on file  Social History Narrative  . Not on file   Social Determinants of Health   Financial Resource Strain:   . Difficulty of Paying Living Expenses:   Food Insecurity:   . Worried About Charity fundraiser in the Last Year:   . Arboriculturist in the Last Year:   Transportation Needs:   . Film/video editor (Medical):   Marland Kitchen Lack of Transportation (Non-Medical):   Physical Activity:   . Days of Exercise per Week:   . Minutes of Exercise per Session:   Stress:   . Feeling of Stress :   Social Connections:   . Frequency of Communication with Friends and Family:   . Frequency of Social Gatherings with Friends and Family:   . Attends Religious Services:   . Active Member of  Clubs or Organizations:   . Attends Archivist Meetings:   Marland Kitchen Marital Status:      Family History: The patient's family history includes Colon cancer in his mother; Colon polyps in his mother; Esophageal cancer in his maternal great-grandfather; Rectal cancer in his mother. There is no history of Stomach cancer.  ROS:   Review of Systems  Constitution: Negative for decreased appetite, fever and weight gain.  HENT: Negative for congestion, ear discharge, hoarse voice and sore throat.   Eyes: Negative for discharge, redness, vision loss in right eye and visual halos.  Cardiovascular: Negative for chest pain, dyspnea on exertion, leg swelling, orthopnea and  palpitations.  Respiratory: Negative for cough, hemoptysis, shortness of breath and snoring.   Endocrine: Negative for heat intolerance and polyphagia.  Hematologic/Lymphatic: Negative for bleeding problem. Does not bruise/bleed easily.  Skin: Negative for flushing, nail changes, rash and suspicious lesions.  Musculoskeletal: Negative for arthritis, joint pain, muscle cramps, myalgias, neck pain and stiffness.  Gastrointestinal: Negative for abdominal pain, bowel incontinence, diarrhea and excessive appetite.  Genitourinary: Negative for decreased libido, genital sores and incomplete emptying.  Neurological: Negative for brief paralysis, focal weakness, headaches and loss of balance.  Psychiatric/Behavioral: Negative for altered mental status, depression and suicidal ideas.  Allergic/Immunologic: Negative for HIV exposure and persistent infections.    EKGs/Labs/Other Studies Reviewed:    The following studies were reviewed today:   EKG:  The ekg ordered today demonstrates   Recent Labs: 07/23/2019: TSH 8.277 07/25/2019: Hemoglobin 12.8; Platelets 240 10/14/2019: ALT CANCELED; BUN CANCELED; Creatinine, Ser CANCELED; Magnesium CANCELED; Potassium CANCELED; Sodium CANCELED  Recent Lipid Panel    Component Value Date/Time    CHOL CANCELED 10/14/2019 1020   TRIG CANCELED 10/14/2019 1020   HDL CANCELED 10/14/2019 1020   CHOLHDL 2.0 10/14/2019 0000   CHOLHDL 5.9 07/24/2019 0105   VLDL 39 07/24/2019 0105   LDLCALC 24 10/14/2019 0000    Physical Exam:    VS:  BP 110/76 (BP Location: Left Arm, Patient Position: Sitting, Cuff Size: Normal)   Pulse 68   Ht 5\' 8"  (1.727 m)   Wt 179 lb (81.2 kg)   SpO2 99%   BMI 27.22 kg/m     Wt Readings from Last 3 Encounters:  11/13/19 179 lb (81.2 kg)  10/14/19 180 lb (81.6 kg)  08/02/19 197 lb (89.4 kg)     GEN: Well nourished, well developed in no acute distress HEENT: Normal NECK: No JVD; No carotid bruits LYMPHATICS: No lymphadenopathy CARDIAC: S1S2 noted,RRR, no murmurs, rubs, gallops RESPIRATORY:  Clear to auscultation without rales, wheezing or rhonchi  ABDOMEN: Soft, non-tender, non-distended, +bowel sounds, no guarding. EXTREMITIES: No edema, No cyanosis, no clubbing MUSCULOSKELETAL:  No deformity  SKIN: Warm and dry NEUROLOGIC:  Alert and oriented x 3, non-focal PSYCHIATRIC:  Normal affect, good insight  ASSESSMENT:    1. Ischemic cardiomyopathy   2. Coronary artery disease involving native coronary artery of native heart without angina pectoris   3. Thoracic aortic aneurysm without rupture ([place])   4. Mixed hyperlipidemia    PLAN:    The patient appears to be improved clinically from a cardiovascular standpoint.  Thankfully he has not had any dizzy episodes since we stopped the antihypertensives.  I was able to review his most recent lipid profile with him his LDL is 24, triglyceride 92, total cholesterol 85, HDL 43.  At this time is not unreasonable to cut back on his Crestor to 20 mg daily.  He plans to get a CTA with his oncologist which will include a CT of the chest and we can be able to reconfirm his proximal ascending aorta dilatation.  He completed his cardiac rehab.  He is happy with the program.  And has been walking daily to exercise.  He  plans to apply for the Silver sneakers program.  The patient is in agreement with the above plan. The patient left the office in stable condition.  The patient will follow up in 6 months or sooner if needed.    Medication Adjustments/Labs and Tests Ordered: Current medicines are reviewed at length with the patient today.  Concerns regarding medicines are outlined  above.  No orders of the defined types were placed in this encounter.  Meds ordered this encounter  Medications  . rosuvastatin (CRESTOR) 20 MG tablet    Sig: Take 1 tablet (20 mg total) by mouth daily.    Dispense:  30 tablet    Refill:  3    Patient Instructions  Medication Instructions:  Your physician has recommended you make the following change in your medication:  DECREASE: Crestor 20 mg take one tablet by mouth daily.  *If you need a refill on your cardiac medications before your next appointment, please call your pharmacy*   Lab Work: None If you have labs (blood work) drawn today and your tests are completely normal, you will receive your results only by: Marland Kitchen MyChart Message (if you have MyChart) OR . A paper copy in the mail If you have any lab test that is abnormal or we need to change your treatment, we will call you to review the results.   Testing/Procedures: None   Follow-Up: At [facility], you and your health needs are our priority.  As part of our continuing mission to provide you with exceptional heart care, we have created designated Provider Care Teams.  These Care Teams include your primary Cardiologist (physician) and Advanced Practice Providers (APPs -  Physician Assistants and Nurse Practitioners) who all work together to provide you with the care you need, when you need it.  We recommend signing up for the patient portal called "MyChart".  Sign up information is provided on this After Visit Summary.  MyChart is used to connect with patients for Virtual Visits (Telemedicine).  Patients are  able to view lab/test results, encounter notes, upcoming appointments, etc.  Non-urgent messages can be sent to your provider as well.   To learn more about what you can do with MyChart, go to [email or web address].    Your next appointment:   6 month(s)  The format for your next appointment:   In Person  Provider:   Berniece Salines, DO   Other Instructions      Adopting a Healthy Lifestyle.  Know what a healthy weight is for you (roughly BMI <25) and aim to maintain this   Aim for 7+ servings of fruits and vegetables daily   65-80+ fluid ounces of water or unsweet tea for healthy kidneys   Limit to max 1 drink of alcohol per day; avoid smoking/tobacco   Limit animal fats in diet for cholesterol and heart health - choose grass fed whenever available   Avoid highly processed foods, and foods high in saturated/trans fats   Aim for low stress - take time to unwind and care for your mental health   Aim for 150 min of moderate intensity exercise weekly for heart health, and weights twice weekly for bone health   Aim for 7-9 hours of sleep daily   When it comes to diets, agreement about the perfect plan isnt easy to find, even among the experts. Experts at the [employer] developed an idea known as the Healthy Eating Plate. Just imagine a plate divided into logical, healthy portions.   The emphasis is on diet quality:   Load up on vegetables and fruits - one-half of your plate: Aim for color and variety, and remember that potatoes dont count.   Go for whole grains - one-quarter of your plate: Whole wheat, barley, wheat berries, quinoa, oats, brown rice, and foods made with them. If you want pasta, go with whole  wheat pasta.   Protein power - one-quarter of your plate: Fish, chicken, beans, and nuts are all healthy, versatile protein sources. Limit red meat.   The diet, however, does go beyond the plate, offering a few other suggestions.   Use healthy  plant oils, such as olive, canola, soy, corn, sunflower and peanut. Check the labels, and avoid partially hydrogenated oil, which have unhealthy trans fats.   If youre thirsty, drink water. Coffee and tea are good in moderation, but skip sugary drinks and limit milk and dairy products to one or two daily servings.   The type of carbohydrate in the diet is more important than the amount. Some sources of carbohydrates, such as vegetables, fruits, whole grains, and beans-are healthier than others.   Finally, stay active  Signed, Jonathon Salines, DO  11/13/2019 10:13 AM    [place]

## 2019-11-13 NOTE — Patient Instructions (Signed)
Medication Instructions:  Your physician has recommended you make the following change in your medication:  DECREASE: Crestor 20 mg take one tablet by mouth daily.  *If you need a refill on your cardiac medications before your next appointment, please call your pharmacy*   Lab Work: None If you have labs (blood work) drawn today and your tests are completely normal, you will receive your results only by: Marland Kitchen MyChart Message (if you have MyChart) OR . A paper copy in the mail If you have any lab test that is abnormal or we need to change your treatment, we will call you to review the results.   Testing/Procedures: None   Follow-Up: At [facility], you and your health needs are our priority.  As part of our continuing mission to provide you with exceptional heart care, we have created designated Provider Care Teams.  These Care Teams include your primary Cardiologist (physician) and Advanced Practice Providers (APPs -  Physician Assistants and Nurse Practitioners) who all work together to provide you with the care you need, when you need it.  We recommend signing up for the patient portal called "MyChart".  Sign up information is provided on this After Visit Summary.  MyChart is used to connect with patients for Virtual Visits (Telemedicine).  Patients are able to view lab/test results, encounter notes, upcoming appointments, etc.  Non-urgent messages can be sent to your provider as well.   To learn more about what you can do with MyChart, go to [email or web address].    Your next appointment:   6 month(s)  The format for your next appointment:   In Person  Provider:   Berniece Salines, DO   Other Instructions

## 2019-11-20 ENCOUNTER — Other Ambulatory Visit: Payer: Self-pay | Admitting: Interventional Radiology

## 2019-11-20 DIAGNOSIS — N2889 Other specified disorders of kidney and ureter: Secondary | ICD-10-CM

## 2019-12-05 ENCOUNTER — Other Ambulatory Visit: Payer: Self-pay

## 2019-12-05 ENCOUNTER — Ambulatory Visit
Admission: RE | Admit: 2019-12-05 | Discharge: 2019-12-05 | Disposition: A | Discharge status: Home / Self Care | Payer: Medicare PPO | Source: Ambulatory Visit | Attending: Interventional Radiology | Admitting: Interventional Radiology

## 2019-12-05 ENCOUNTER — Encounter: Payer: Self-pay | Admitting: *Deleted

## 2019-12-05 DIAGNOSIS — N2889 Other specified disorders of kidney and ureter: Secondary | ICD-10-CM

## 2019-12-05 HISTORY — PX: IR RADIOLOGIST EVAL & MGMT: IMG5224

## 2019-12-05 NOTE — Progress Notes (Signed)
Patient ID: ANCE REDIC, male   DOB: 04-May-1946, 74 y.o.   MRN: [id number]       Chief Complaint:  Left renal cryoablation, 2-year follow-up, surveillance imaging  Referring Physician(s): [place]  History of Present Illness: Jonathon Oneill is a 74 y.o. male who is now nearly 2 years status post cryoablation of a solid enhancing 13 mm left renal mass/neoplasm.  He continues to do very well.  No recurrent flank or abdominal pain.  No urinary tract symptoms, hematuria or dysuria.  No recent illness or fever.  Stable weight and appetite.  Excellent functional status.  Overall he is doing very well.  He has a remote history of colorectal cancer status post radiation, surgery and chemo.  Currently has no active disease.  Past Medical History:  Diagnosis Date   Abdominal pain, acute, generalized 04/05/2017   Overview:  Added automatically from request for surgery [id number]  Formatting of this note might be different from the original. Added automatically from request for surgery [id number]   Abnormal CT of the abdomen 04/05/2017   Overview:  Added automatically from request for surgery [id number]  Formatting of this note might be different from the original. Added automatically from request for surgery [id number]   Allergy    CAD (coronary artery disease), native coronary artery 07/24/2019   LHC 07/24/19: DES-pLAD, DES-dRCA   Cancer ([place]) 04/2017   rectal cancer   Cataract    Chronic kidney disease 04/2017   left renal mass   Chronic systolic heart failure ([place])    Encounter for antineoplastic chemotherapy 05/03/2017   Epiretinal membrane (ERM) of left eye 05/09/2013   Glaucoma    Ischemic cardiomyopathy    35-45% on [facility] 07/24/19   Lamellar macular hole, left eye 05/09/2013   Left renal mass 04/05/2017   Mass of left thigh 03/25/2019   Mixed hyperlipidemia 08/02/2019   Multiple thyroid nodules 06/02/2017   Non-ST elevation (NSTEMI) myocardial infarction ([place]) 07/23/2019   Nontoxic single  thyroid nodule 06/14/2017   Nuclear sclerotic cataract of left eye 01/24/2013   Obesity    Obesity, unspecified 05/21/2012   Primary open-angle glaucoma 04/16/2012   Pseudophakia 09/13/2012   Overview:  S/p YAG CAPS OD ~ 11/2012  Formatting of this note might be different from the original. Overview:  S/p YAG CAPS OD ~ 11/2012 Overview:  Overview:  S/p YAG CAPS OD ~ 11/2012   Pseudophakia of right eye 11/2012   Pulmonary nodules 04/2017   Rectal carcinoma ([place]) 04/18/2017   Retinal break of left eye 04/30/2012   Thyroid nodule 04/2017   multiple    Past Surgical History:  Procedure Laterality Date   COLON SURGERY  08/2017   COLONOSCOPY  04/2017   CORONARY STENT INTERVENTION N/A 07/24/2019   Procedure: CORONARY STENT INTERVENTION;  Surgeon: Jettie Booze, MD;  Location: [place] [facility];  Service: Cardiovascular;  Laterality: N/A;   EYE SURGERY     POAG; lamellar macular hole on left; retinal break of left eye; nuclear sclerotic cataract left eye   FINGER SURGERY     IR RADIOLOGIST EVAL & MGMT  06/15/2017   IR RADIOLOGIST EVAL & MGMT  11/28/2017   IR RADIOLOGIST EVAL & MGMT  01/10/2018   IR RADIOLOGIST EVAL & MGMT  04/17/2018   KIDNEY SURGERY     LEFT HEART CATH AND CORONARY ANGIOGRAPHY N/A 07/24/2019   Procedure: LEFT HEART CATH AND CORONARY ANGIOGRAPHY;  Surgeon: Jettie Booze, MD;  Location: [place] [facility];  Service: Cardiovascular;  Laterality: N/A;    Allergies: Influenza vaccines and Ciprofloxacin  Medications: Prior to Admission medications   Medication Sig Start Date End Date Taking? Authorizing Provider  aspirin EC 81 MG tablet Take 1 tablet (81 mg total) by mouth daily. 07/23/19   Tobb, Kardie, DO  Cholecalciferol (VITAMIN D3) 2000 units capsule Take 2,000 Units by mouth at bedtime.     [provider]  dorzolamide-timolol (COSOPT) 22.3-6.8 MG/ML ophthalmic solution  10/14/19   [provider]  latanoprost (XALATAN)  0.005 % ophthalmic solution Place 1 drop into both eyes at bedtime.  03/21/16   [provider]  loperamide (IMODIUM) 2 MG capsule Take 2 mg by mouth as needed for diarrhea or loose stools.    [provider]  nitroGLYCERIN (NITROSTAT) 0.4 MG SL tablet Place 1 tablet (0.4 mg total) under the tongue every 5 (five) minutes x 3 doses as needed for chest pain. 07/25/19   Duke, Tami Lin, PA  rosuvastatin (CRESTOR) 20 MG tablet Take 1 tablet (20 mg total) by mouth daily. 11/13/19 02/11/20  Tobb, Godfrey Pick, DO  ticagrelor (BRILINTA) 90 MG TABS tablet Take 1 tablet (90 mg total) by mouth 2 (two) times daily. 08/07/19   Tobb, Kardie, DO  TURMERIC PO Take 1 tablet by mouth daily.    [provider]     Family History  Problem Relation Age of Onset   Colon cancer Mother    Colon polyps Mother    Rectal cancer Mother    Esophageal cancer Maternal Great-grandfather    Stomach cancer Neg Hx     Social History   Socioeconomic History   Marital status: Married    Spouse name: Not on file   Number of children: 4   Years of education: Not on file   Highest education level: Not on file  Occupational History   Occupation: Retired   Tobacco Use   Smoking status: Never Smoker   Smokeless tobacco: Never Used  Substance and Sexual Activity   Alcohol use: Not Currently   Drug use: Never   Sexual activity: Not on file  Other Topics Concern   Not on file  Social History Narrative   Not on file   Social Determinants of Health   Financial Resource Strain:    Difficulty of Paying Living Expenses:   Food Insecurity:    Worried About Charity fundraiser in the Last Year:    Arboriculturist in the Last Year:   Transportation Needs:    Film/video editor (Medical):    Lack of Transportation (Non-Medical):   Physical Activity:    Days of Exercise per Week:    Minutes of Exercise per Session:   Stress:    Feeling of Stress :   Social Connections:     Frequency of Communication with Friends and Family:    Frequency of Social Gatherings with Friends and Family:    Attends Religious Services:    Active Member of Clubs or Organizations:    Attends Music therapist:    Marital Status:     ECOG Status: 0 - Asymptomatic  Review of Systems  Review of Systems: A 12 point ROS discussed and pertinent positives are indicated in the HPI above.  All other systems are negative.  Physical Exam No direct physical exam was performed, telephone health visit only Vital Signs: There were no vitals taken for this visit.  Imaging: No results found.  Labs:  CBC: Recent  Labs    07/24/19 0105 07/25/19 0355  WBC 6.3 5.1  HGB 13.8 12.8*  HCT 40.8 38.3*  PLT 217 240    COAGS: Recent Labs    07/20/19 0000  INR 0.9    BMP: Recent Labs    07/24/19 0105 07/25/19 0355 10/14/19 0000 10/14/19 1020  NA 133* 137 140 CANCELED  K 3.7 3.5 4.0 CANCELED  CL 97* 102 105 CANCELED  CO2 25 23 22  CANCELED  GLUCOSE 105* 110* 95 CANCELED  BUN 25* 17 11 CANCELED  CALCIUM 8.3* 8.3* 9.0 CANCELED  CREATININE 1.24 1.08 0.95 CANCELED  GFRNONAA 58* >60 80  --   GFRAA >60 >60 92  --     LIVER FUNCTION TESTS: Recent Labs    07/24/19 0105 10/14/19 0000 10/14/19 1020  BILITOT 1.2 0.7 CANCELED  AST 46* 29 CANCELED  ALT 30 33 CANCELED  ALKPHOS 60 56 CANCELED  PROT 6.8 6.4 CANCELED  ALBUMIN 3.0* 4.3 CANCELED    TUMOR MARKERS: No results for input(s): AFPTM, CEA, CA199, CHROMGRNA in the last 8760 hours.  Assessment and Plan:  Nearly 2 years status post successful CT-guided cryoablation of a small enhancing left renal mass compatible with renal cell carcinoma/neoplasm by imaging.  Surveillance imaging performed 11/18/2019 at [facility] demonstrates a stable ablation defect without evidence of residual or recurrent disease.  No new renal abnormality.  Plan: Repeat surveillance CT without and with contrast in 12  months at [facility] (coordinate with Dr. Cruzita Lederer who also is ordering follow-up imaging for his colorectal surveillance).  Electronically Signed: Greggory Keen 12/05/2019, 10:53 AM   I spent a total of    25 Minutes in remote  clinical consultation, greater than 50% of which was counseling/coordinating care for this patient with a small left renal neoplasm status post ablation.    Visit type: Audio only (telephone). Audio (no video) only due to patient's lack of internet/smartphone capability. Alternative for in-person consultation at [facility], [address] [place], [place], [place]. This visit type was conducted due to national recommendations for restrictions regarding the COVID-19 Pandemic (e.g. social distancing).  This format is felt to be most appropriate for this patient at this time.  All issues noted in this document were discussed and addressed.

## 2020-01-11 ENCOUNTER — Other Ambulatory Visit: Payer: Self-pay | Admitting: Cardiology

## 2020-02-15 ENCOUNTER — Other Ambulatory Visit: Payer: Self-pay | Admitting: Cardiology

## 2020-03-26 ENCOUNTER — Telehealth: Payer: Self-pay | Admitting: Cardiology

## 2020-03-26 MED ORDER — ROSUVASTATIN CALCIUM 20 MG PO TABS: 20.0000 mg | ORAL_TABLET | Freq: Every day | ORAL | 1 refills | Status: DC

## 2020-03-26 NOTE — Telephone Encounter (Signed)
Returned call to patient and he is requesting a refill on his Crestor.  He was made a follow up appointment for November. Refill sent.

## 2020-03-26 NOTE — Telephone Encounter (Signed)
New message:    Patient would like to speak with a nurse concering his medication. Please call patient.

## 2020-05-11 ENCOUNTER — Other Ambulatory Visit: Payer: Self-pay

## 2020-05-11 DIAGNOSIS — H269 Unspecified cataract: Secondary | ICD-10-CM | POA: Insufficient documentation

## 2020-05-11 DIAGNOSIS — H409 Unspecified glaucoma: Secondary | ICD-10-CM | POA: Insufficient documentation

## 2020-05-11 DIAGNOSIS — T7840XA Allergy, unspecified, initial encounter: Secondary | ICD-10-CM | POA: Insufficient documentation

## 2020-05-12 ENCOUNTER — Ambulatory Visit: Payer: Medicare PPO | Admitting: Cardiology

## 2020-05-12 ENCOUNTER — Other Ambulatory Visit: Payer: Self-pay

## 2020-05-12 ENCOUNTER — Encounter: Payer: Self-pay | Admitting: Cardiology

## 2020-05-12 VITALS — BP 112/60 | HR 79 | Ht 68.0 in | Wt 171.2 lb

## 2020-05-12 DIAGNOSIS — I255 Ischemic cardiomyopathy: Secondary | ICD-10-CM | POA: Diagnosis not present

## 2020-05-12 DIAGNOSIS — I214 Non-ST elevation (NSTEMI) myocardial infarction: Secondary | ICD-10-CM | POA: Diagnosis not present

## 2020-05-12 DIAGNOSIS — I5022 Chronic systolic (congestive) heart failure: Secondary | ICD-10-CM

## 2020-05-12 DIAGNOSIS — I2510 Atherosclerotic heart disease of native coronary artery without angina pectoris: Secondary | ICD-10-CM | POA: Diagnosis not present

## 2020-05-12 NOTE — Progress Notes (Signed)
Cardiology Office Note:    Date:  05/12/2020   ID:  VINCEN BEJAR, DOB 12/06/1945, MRN [id number]  PCP:  Nicoletta Dress, MD  Cardiologist:  Berniece Salines, DO  Electrophysiologist:  None   Referring MD: Nicoletta Dress, MD   " I am doing well"   History of Present Illness:    Jonathon Oneill is a 74 y.o. male with a hx of CAD status post stent to his LAD and RCA placed in January 20,2021 on dual antiplatelet therapy, hyperlipidemia, ischemic cardiomyopathy his EF has improved slightly on his recent echo from 45-50% from 35-40% in 07/2019 and proximal ascending aorta location.   I saw the patient on 11/13/2019 at that time I cut back on his crestor to 20 mg daily. He was planing a CTA with his oncologist  Which will also give [place] information on the ascending aorta dilatation. He was involved in exercise program and was happy with it.  Unfortunately the patient's oncologist only ordered a CT scan of the abdomen.  He is here today for follow-up visit.  He is doing well from a cardiovascular standpoint.  I talked to the patient in great detail that we need to get his CT scan of the chest done to reassess his ascending aorta dilatation but he is declining and he still persists that he wants to get this done with his oncologist when they do his abdominal CT.   The patient was also asking for exemption form the Covid vaccine due to his cardiac disease.  Advised the patient that from a cardiovascular standpoint is beneficial to get the Covid vaccine.  None of the medications that he takes would interact with the vaccine.   Past Medical History:  Diagnosis Date  . Abdominal pain, acute, generalized 04/05/2017   Overview:  Added automatically from request for surgery [id number]  Formatting of this note might be different from the original. Added automatically from request for surgery [phone]  . Abnormal CT of the abdomen 04/05/2017   Overview:  Added automatically from request for surgery [id number]   Formatting of this note might be different from the original. Added automatically from request for surgery [id number]  . Allergy   . CAD (coronary artery disease), native coronary artery 07/24/2019   LHC 07/24/19: DES-pLAD, DES-dRCA  . Cancer ([place]) 04/2017   rectal cancer  . Cataract   . Chronic kidney disease 04/2017   left renal mass  . Chronic systolic heart failure ([place])   . Encounter for antineoplastic chemotherapy 05/03/2017  . Epiretinal membrane (ERM) of left eye 05/09/2013  . Glaucoma   . Ischemic cardiomyopathy    35-45% on [facility] 07/24/19  . Lamellar macular hole, left eye 05/09/2013  . Left renal mass 04/05/2017  . Mass of left thigh 03/25/2019  . Mixed hyperlipidemia 08/02/2019  . Multiple thyroid nodules 06/02/2017  . Non-ST elevation (NSTEMI) myocardial infarction ([place]) 07/23/2019  . Nontoxic single thyroid nodule 06/14/2017  . Nuclear sclerotic cataract of left eye 01/24/2013  . Obesity   . Obesity, unspecified 05/21/2012  . Primary open-angle glaucoma 04/16/2012  . Pseudophakia 09/13/2012   Overview:  S/p YAG CAPS OD ~ 11/2012  Formatting of this note might be different from the original. Overview:  S/p YAG CAPS OD ~ 11/2012 Overview:  Overview:  S/p YAG CAPS OD ~ 11/2012  . Pseudophakia of right eye 11/2012  . Pulmonary nodules 04/2017  . Rectal carcinoma ([place]) 04/18/2017  . Retinal break of left eye 04/30/2012  .  Thyroid nodule 04/2017   multiple    Past Surgical History:  Procedure Laterality Date  . COLON SURGERY  08/2017  . COLONOSCOPY  04/2017  . CORONARY STENT INTERVENTION N/A 07/24/2019   Procedure: CORONARY STENT INTERVENTION;  Surgeon: Jettie Booze, MD;  Location: [place] [facility];  Service: Cardiovascular;  Laterality: N/A;  . 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 FINGER SURGERY    . IR RADIOLOGIST EVAL & MGMT  06/15/2017  . IR RADIOLOGIST EVAL & MGMT  11/28/2017  . IR  RADIOLOGIST EVAL & MGMT  01/10/2018  . IR RADIOLOGIST EVAL & MGMT  04/17/2018  . IR RADIOLOGIST EVAL & MGMT  12/05/2019  . KIDNEY SURGERY    . LEFT HEART CATH AND CORONARY ANGIOGRAPHY N/A 07/24/2019   Procedure: LEFT HEART CATH AND CORONARY ANGIOGRAPHY;  Surgeon: Jettie Booze, MD;  Location: [place] [facility];  Service: Cardiovascular;  Laterality: N/A;    Current Medications: No outpatient medications have been marked as taking for the 05/12/20 encounter (Appointment) with Berniece Salines, DO.     Allergies:   Influenza vaccines and Ciprofloxacin   Social History   Socioeconomic History  . Marital status: Married    Spouse name: Not on file  . Number of children: 4  . Years of education: Not on file  . Highest education level: Not on file  Occupational History  . Occupation: Retired   Tobacco Use  . Smoking status: Never Smoker  . Smokeless tobacco: Never Used  Vaping Use  . Vaping Use: Never used  Substance and Sexual Activity  . Alcohol use: Not Currently  . Drug use: Never  . Sexual activity: Not on file  Other Topics Concern  . Not on file  Social History Narrative  . Not on file   Social Determinants of Health   Financial Resource Strain:   . Difficulty of Paying Living Expenses: Not on file  Food Insecurity:   . Worried About Charity fundraiser in the Last Year: Not on file  . Ran Out of Food in the Last Year: Not on file  Transportation Needs:   . Lack of Transportation (Medical): Not on file  . Lack of Transportation (Non-Medical): Not on file  Physical Activity:   . Days of Exercise per Week: Not on file  . Minutes of Exercise per Session: Not on file  Stress:   . Feeling of Stress : Not on file  Social Connections:   . Frequency of Communication with Friends and Family: Not on file  . Frequency of Social Gatherings with Friends and Family: Not on file  . Attends Religious Services: Not on file  . Active Member of Clubs or Organizations: Not on  file  . Attends Archivist Meetings: Not on file  . Marital Status: Not on file     Family History: The patient's family history includes Colon cancer in his mother; Colon polyps in his mother; Esophageal cancer in his maternal great-grandfather; Rectal cancer in his mother. There is no history of Stomach cancer.  ROS:   Review of Systems  Constitution: Negative for decreased appetite, fever and weight gain.  HENT: Negative for congestion, ear discharge, hoarse voice and sore throat.   Eyes: Negative for discharge, redness, vision loss in right eye and visual halos.  Cardiovascular: Negative for chest pain, dyspnea on exertion, leg swelling, orthopnea and  palpitations.  Respiratory: Negative for cough, hemoptysis, shortness of breath and snoring.   Endocrine: Negative for heat intolerance and polyphagia.  Hematologic/Lymphatic: Negative for bleeding problem. Does not bruise/bleed easily.  Skin: Negative for flushing, nail changes, rash and suspicious lesions.  Musculoskeletal: Negative for arthritis, joint pain, muscle cramps, myalgias, neck pain and stiffness.  Gastrointestinal: Negative for abdominal pain, bowel incontinence, diarrhea and excessive appetite.  Genitourinary: Negative for decreased libido, genital sores and incomplete emptying.  Neurological: Negative for brief paralysis, focal weakness, headaches and loss of balance.  Psychiatric/Behavioral: Negative for altered mental status, depression and suicidal ideas.  Allergic/Immunologic: Negative for HIV exposure and persistent infections.    EKGs/Labs/Other Studies Reviewed:    The following studies were reviewed today:   EKG:  The ekg ordered today demonstrates    CTA abdomen FINDINGS:  Lower chest: No acute abnormality.Stable, benign small subpleural  pulmonary nodule of the right lower lobe (series 3, image 4).   Hepatobiliary: No solid liver abnormality is seen. No gallstones,  gallbladder wall  thickening, or biliary dilatation.   Pancreas: Unremarkable. No pancreatic ductal dilatation or  surrounding inflammatory changes.   Spleen: Normal in size without significant abnormality.   Adrenals/Urinary Tract: Adrenal glands are unremarkable. Unchanged  appearance of a small ablation site of the lateral inferior pole of  left kidney (series 2, image 43). Unchanged thickening of the  urinary bladder.   Stomach/Bowel: Stomach is within normal limits. Appendix appears  normal. Redemonstrated postoperative findings of low anterior  resection and reanastomosis. Large burden of stool throughout the  colon and rectum.   Vascular/Lymphatic: Aortic atherosclerosis. No enlarged abdominal or  pelvic lymph nodes.   Reproductive: Mild prostatomegaly.   Other: No abdominal wall hernia or abnormality. No abdominopelvic  ascites.   Musculoskeletal: No acute or significant osseous findings.   IMPRESSION:  1. Unchanged postoperative findings of low anterior resection and  reanastomosis.  2. Unchanged appearance of a small ablation site of the lateral  inferior pole of the left kidney.  3. No evidence of recurrent or metastatic disease in the abdomen or  pelvis.  4. Unchanged thickening of the urinary bladder, likely due to  chronic outlet obstruction.  5. Mild prostatomegaly.  6. Coronary artery disease. Aortic Atherosclerosis (ICD10-I70.0).    Recent Labs: 07/23/2019: TSH 8.277 07/25/2019: Hemoglobin 12.8; Platelets 240 10/14/2019: ALT CANCELED; BUN CANCELED; Creatinine, Ser CANCELED; Magnesium CANCELED; Potassium CANCELED; Sodium CANCELED  Recent Lipid Panel    Component Value Date/Time   CHOL CANCELED 10/14/2019 1020   TRIG CANCELED 10/14/2019 1020   HDL CANCELED 10/14/2019 1020   CHOLHDL 2.0 10/14/2019 0000   CHOLHDL 5.9 07/24/2019 0105   VLDL 39 07/24/2019 0105   LDLCALC 24 10/14/2019 0000    Physical Exam:    VS:  There were no vitals taken for this visit.    Wt  Readings from Last 3 Encounters:  11/13/19 179 lb (81.2 kg)  10/14/19 180 lb (81.6 kg)  08/02/19 197 lb (89.4 kg)     GEN: Well nourished, well developed in no acute distress HEENT: Normal NECK: No JVD; No carotid bruits LYMPHATICS: No lymphadenopathy CARDIAC: S1S2 noted,RRR, no murmurs, rubs, gallops RESPIRATORY:  Clear to auscultation without rales, wheezing or rhonchi  ABDOMEN: Soft, non-tender, non-distended, +bowel sounds, no guarding. EXTREMITIES: No edema, No cyanosis, no clubbing MUSCULOSKELETAL:  No deformity  SKIN: Warm and dry NEUROLOGIC:  Alert and oriented x 3, non-focal PSYCHIATRIC:  Normal affect, good insight  ASSESSMENT:    1. Non-ST  elevation (NSTEMI) myocardial infarction ([place])   2. Ischemic cardiomyopathy   3. Chronic systolic heart failure ([place])   4. Coronary artery disease involving native coronary artery of native heart without angina pectoris    PLAN:     1.  He is stable from a cardiovascular standpoint.  Continue his current medication regimen.  My concern is the fact that the patient prefers not to get his CTA done as recommended and would prefer to wait to do this with his oncologist.  This method has failed in the past and I talked to the patient about this and he tells me that he is going to speak with his oncologist I have offered that I can write a prescription for him to get this done at the same time he will notify my office about the date that he has plan to to have his repeat CT scan and we will give him prescriptions at that time.  Ideally it would be best if we can set him up to get this testing because his mother has failed in the past.  But this is not his preference  The patient is in agreement with the above plan. The patient left the office in stable condition.  The patient will follow up in   Medication Adjustments/Labs and Tests Ordered: Current medicines are reviewed at length with the patient today.  Concerns regarding medicines are  outlined above.  No orders of the defined types were placed in this encounter.  No orders of the defined types were placed in this encounter.   There are no Patient Instructions on file for this visit.   Adopting a Healthy Lifestyle.  Know what a healthy weight is for you (roughly BMI <25) and aim to maintain this   Aim for 7+ servings of fruits and vegetables daily   65-80+ fluid ounces of water or unsweet tea for healthy kidneys   Limit to max 1 drink of alcohol per day; avoid smoking/tobacco   Limit animal fats in diet for cholesterol and heart health - choose grass fed whenever available   Avoid highly processed foods, and foods high in saturated/trans fats   Aim for low stress - take time to unwind and care for your mental health   Aim for 150 min of moderate intensity exercise weekly for heart health, and weights twice weekly for bone health   Aim for 7-9 hours of sleep daily   When it comes to diets, agreement about the perfect plan isnt easy to find, even among the experts. Experts at the [employer] developed an idea known as the Healthy Eating Plate. Just imagine a plate divided into logical, healthy portions.   The emphasis is on diet quality:   Load up on vegetables and fruits - one-half of your plate: Aim for color and variety, and remember that potatoes dont count.   Go for whole grains - one-quarter of your plate: Whole wheat, barley, wheat berries, quinoa, oats, brown rice, and foods made with them. If you want pasta, go with whole wheat pasta.   Protein power - one-quarter of your plate: Fish, chicken, beans, and nuts are all healthy, versatile protein sources. Limit red meat.   The diet, however, does go beyond the plate, offering a few other suggestions.   Use healthy plant oils, such as olive, canola, soy, corn, sunflower and peanut. Check the labels, and avoid partially hydrogenated oil, which have unhealthy trans fats.   If youre  thirsty,  drink water. Coffee and tea are good in moderation, but skip sugary drinks and limit milk and dairy products to one or two daily servings.   The type of carbohydrate in the diet is more important than the amount. Some sources of carbohydrates, such as vegetables, fruits, whole grains, and beans-are healthier than others.   Finally, stay active  Signed, Berniece Salines, DO  05/12/2020 10:48 AM    [place]

## 2020-05-12 NOTE — Patient Instructions (Signed)

## 2020-08-11 ENCOUNTER — Telehealth: Payer: Self-pay

## 2020-08-11 ENCOUNTER — Other Ambulatory Visit: Payer: Self-pay

## 2020-08-11 MED ORDER — ROSUVASTATIN CALCIUM 10 MG PO TABS: 10.0000 mg | ORAL_TABLET | Freq: Every day | ORAL | 3 refills | Status: DC

## 2020-08-11 NOTE — Telephone Encounter (Signed)
Patient states he is returning a call.

## 2020-08-11 NOTE — Progress Notes (Signed)
Per Dr. Harriet Masson Crestor decreased to 10 mg and Brilinta discontinued.

## 2020-08-11 NOTE — Telephone Encounter (Signed)
Left message for patient to return our call.

## 2020-08-11 NOTE — Telephone Encounter (Signed)
Spoke with patient about decreasing Crestor 10 mg per Dr. Harriet Masson and stopping Brilinta. Patient verbalizes understanding. No further questions or concerns at this time.

## 2020-11-17 ENCOUNTER — Ambulatory Visit: Payer: Medicare PPO | Admitting: Cardiology

## 2020-11-17 ENCOUNTER — Encounter: Payer: Self-pay | Admitting: Cardiology

## 2020-11-17 ENCOUNTER — Other Ambulatory Visit: Payer: Self-pay

## 2020-11-17 VITALS — BP 108/78 | HR 75 | Ht 68.0 in | Wt 175.4 lb

## 2020-11-17 DIAGNOSIS — I255 Ischemic cardiomyopathy: Secondary | ICD-10-CM

## 2020-11-17 DIAGNOSIS — I2510 Atherosclerotic heart disease of native coronary artery without angina pectoris: Secondary | ICD-10-CM

## 2020-11-17 DIAGNOSIS — E785 Hyperlipidemia, unspecified: Secondary | ICD-10-CM | POA: Diagnosis not present

## 2020-11-17 DIAGNOSIS — I712 Thoracic aortic aneurysm, without rupture, unspecified: Secondary | ICD-10-CM

## 2020-11-17 NOTE — Progress Notes (Signed)
Cardiology Office Note:    Date:  11/17/2020   ID:  KEILAN NICHOL, DOB 1945/08/12, MRN [id number]  PCP:  Nicoletta Dress, MD  Cardiologist:  Berniece Salines, DO  Electrophysiologist:  None   Referring MD: Nicoletta Dress, MD    History of Present Illness:    Jonathon Oneill is a 75 y.o. male with a hx of CAD status post stent to his LAD and RCA placed in January 20,2021 on dual antiplatelet therapy, hyperlipidemia, ischemic cardiomyopathy his EF has improved slightly on his recent echo from 45-50% from 35-40% in 1/2021and proximal ascending aorta location.  I saw the patient on 11/13/2019 at that time I cut back on his crestor to 20 mg daily. He was planing a CTA with his oncologist  Which will also give [place] information on the ascending aorta dilatation. He was involved in exercise program and was happy with it.  I last saw the patient in November 2021 at that time he was stable from a cardiovascular standpoint.  Since I saw the patient he had gotten his CT scan done with [facility] which show stable exam.  We also stopped his Brilinta prior to this visit.  He tells me he is on a diet.  He offers no complaints at this time.  He is happy with his current health situation.  Past Medical History:  Diagnosis Date  . Abdominal pain, acute, generalized 04/05/2017   Overview:  Added automatically from request for surgery [id number]  Formatting of this note might be different from the original. Added automatically from request for surgery [id number]  . Abnormal CT of the abdomen 04/05/2017   Overview:  Added automatically from request for surgery [id number]  Formatting of this note might be different from the original. Added automatically from request for surgery [id number]  . Allergy   . CAD (coronary artery disease), native coronary artery 07/24/2019   LHC 07/24/19: DES-pLAD, DES-dRCA  . Cancer ([place]) 04/2017   rectal cancer  . Cataract   . Chronic kidney disease 04/2017   left renal mass   . Chronic systolic heart failure ([place])   . Encounter for antineoplastic chemotherapy 05/03/2017  . Epiretinal membrane (ERM) of left eye 05/09/2013  . Glaucoma   . Ischemic cardiomyopathy    35-45% on [facility] 07/24/19  . Lamellar macular hole, left eye 05/09/2013  . Left renal mass 04/05/2017  . Mass of left thigh 03/25/2019  . Mixed hyperlipidemia 08/02/2019  . Multiple thyroid nodules 06/02/2017  . Non-ST elevation (NSTEMI) myocardial infarction ([place]) 07/23/2019  . Nontoxic single thyroid nodule 06/14/2017  . Nuclear sclerotic cataract of left eye 01/24/2013  . Obesity   . Obesity, unspecified 05/21/2012  . Primary open-angle glaucoma 04/16/2012  . Pseudophakia 09/13/2012   Overview:  S/p YAG CAPS OD ~ 11/2012  Formatting of this note might be different from the original. Overview:  S/p YAG CAPS OD ~ 11/2012 Overview:  Overview:  S/p YAG CAPS OD ~ 11/2012  . Pseudophakia of right eye 11/2012  . Pulmonary nodules 04/2017  . Rectal carcinoma ([place]) 04/18/2017  . Retinal break of left eye 04/30/2012  . Thyroid nodule 04/2017   multiple    Past Surgical History:  Procedure Laterality Date  . COLON SURGERY  08/2017  . COLONOSCOPY  04/2017  . CORONARY STENT INTERVENTION N/A 07/24/2019   Procedure: CORONARY STENT INTERVENTION;  Surgeon: Jettie Booze, MD;  Location: [place];  Service: Cardiovascular;  Laterality: N/A;  .  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 FINGER SURGERY    . IR RADIOLOGIST EVAL & MGMT  06/15/2017  . IR RADIOLOGIST EVAL & MGMT  11/28/2017  . IR RADIOLOGIST EVAL & MGMT  01/10/2018  . IR RADIOLOGIST EVAL & MGMT  04/17/2018  . IR RADIOLOGIST EVAL & MGMT  12/05/2019  . KIDNEY SURGERY    . LEFT HEART CATH AND CORONARY ANGIOGRAPHY N/A 07/24/2019   Procedure: LEFT HEART CATH AND CORONARY ANGIOGRAPHY;  Surgeon: Jettie Booze, MD;  Location: [place] [facility];  Service:  Cardiovascular;  Laterality: N/A;    Current Medications: No outpatient medications have been marked as taking for the 11/17/20 encounter (Office Visit) with Berniece Salines, DO.     Allergies:   Influenza vaccines and Ciprofloxacin   Social History   Socioeconomic History  . Marital status: Married    Spouse name: Not on file  . Number of children: 4  . Years of education: Not on file  . Highest education level: Not on file  Occupational History  . Occupation: Retired   Tobacco Use  . Smoking status: Never Smoker  . Smokeless tobacco: Never Used  Vaping Use  . Vaping Use: Never used  Substance and Sexual Activity  . Alcohol use: Not Currently  . Drug use: Never  . Sexual activity: Not on file  Other Topics Concern  . Not on file  Social History Narrative  . Not on file   Social Determinants of Health   Financial Resource Strain: Not on file  Food Insecurity: Not on file  Transportation Needs: Not on file  Physical Activity: Not on file  Stress: Not on file  Social Connections: Not on file     Family History: The patient's family history includes Colon cancer in his mother; Colon polyps in his mother; Esophageal cancer in his maternal great-grandfather; Rectal cancer in his mother. There is no history of Stomach cancer.  ROS:   Review of Systems  Constitution: Negative for decreased appetite, fever and weight gain.  HENT: Negative for congestion, ear discharge, hoarse voice and sore throat.   Eyes: Negative for discharge, redness, vision loss in right eye and visual halos.  Cardiovascular: Negative for chest pain, dyspnea on exertion, leg swelling, orthopnea and palpitations.  Respiratory: Negative for cough, hemoptysis, shortness of breath and snoring.   Endocrine: Negative for heat intolerance and polyphagia.  Hematologic/Lymphatic: Negative for bleeding problem. Does not bruise/bleed easily.  Skin: Negative for flushing, nail changes, rash and suspicious lesions.   Musculoskeletal: Negative for arthritis, joint pain, muscle cramps, myalgias, neck pain and stiffness.  Gastrointestinal: Negative for abdominal pain, bowel incontinence, diarrhea and excessive appetite.  Genitourinary: Negative for decreased libido, genital sores and incomplete emptying.  Neurological: Negative for brief paralysis, focal weakness, headaches and loss of balance.  Psychiatric/Behavioral: Negative for altered mental status, depression and suicidal ideas.  Allergic/Immunologic: Negative for HIV exposure and persistent infections.    EKGs/Labs/Other Studies Reviewed:    The following studies were reviewed today:   EKG:  The ekg ordered today demonstrates sinus rhythm, heart rate 75 bpm with evidence of old anterior wall infarction.  CT scan done Nov 10, 2020 CT CHEST FINDINGS   Cardiovascular: The heart size is normal. No substantial pericardial  effusion. Coronary artery calcification is evident. Atherosclerotic  calcification is noted in the wall of the thoracic aorta.   Mediastinum/Nodes: 2.1  cm left thyroid nodule stable since chest CT  04/16/2018. No follow-up imaging recommended (ref: J Am Coll Radiol.  2015 Feb;12(2): 143-50). No mediastinal lymphadenopathy. There is no  hilar lymphadenopathy. The esophagus has normal imaging features.  There is no axillary lymphadenopathy.   Lungs/Pleura: 4 mm right lower lobe nodule on 94/4 is stable since  2019. Tiny right upper lobe pulmonary nodule seen on images 55 and  66 are unchanged since 2019 consistent with benign etiology. No new  suspicious pulmonary nodule or mass. No focal airspace  consolidation. No pleural effusion.   Musculoskeletal: No worrisome lytic or sclerotic osseous  abnormality.   CT ABDOMEN PELVIS FINDINGS   Hepatobiliary: No suspicious focal abnormality within the liver  parenchyma. Tiny hypodensity in the central right liver is stable  consistent with benign etiology. There is no evidence  for  gallstones, gallbladder wall thickening, or pericholecystic fluid.  No intrahepatic or extrahepatic biliary dilation.   Pancreas: No focal mass lesion. No dilatation of the main duct. No  intraparenchymal cyst. No peripancreatic edema.   Spleen: No splenomegaly. No focal mass lesion.   Adrenals/Urinary Tract: No adrenal nodule or mass. Right kidney  unremarkable. Stable appearance ablation defect lower interpolar  left kidney. No evidence for hydroureter. Mild circumferential  bladder wall thickening again noted, eccentric to the left.   Stomach/Bowel: Stomach is unremarkable. No gastric wall thickening.  No evidence of outlet obstruction. Duodenum is normally positioned  as is the ligament of Treitz. No small bowel wall thickening. No  small bowel dilatation. Rectal anastomosis is again noted with  sequelae of low anterior section. Surgical bed is stable in  appearance in the interval. Colon is distended with stool.   Vascular/Lymphatic: There is abdominal aortic atherosclerosis  without aneurysm. There is no gastrohepatic or hepatoduodenal  ligament lymphadenopathy. No retroperitoneal or mesenteric  lymphadenopathy. No pelvic sidewall lymphadenopathy.   Reproductive: Prostate gland appears enlarged.   Other: No intraperitoneal free fluid.   Musculoskeletal: No worrisome lytic or sclerotic osseous  abnormality.   IMPRESSION:  1. Stable exam. No new or progressive findings to suggest recurrent  or metastaticdisease in the chest, abdomen, or pelvis.  2. Stable appearance ablation defect lower interpolar left kidney.  3. Stable tiny right pulmonary nodules, consistent with benign  etiology.  4. Prostatomegaly with circumferential bladder wall thickening, more  prominent on the left.  5. Aortic Atherosclerosis (ICD10-I70.0).    Electronically Signed  By: Misty Stanley M.D.  On: 11/11/2020 11:11  Procedure Note  Virgilio Frees, MD - 11/11/2020   Formatting of this note might be different from the original.  CLINICAL DATA: Rectal cancer status post LAR. Also with a history  of left renal cell carcinoma status post cryoablation. Restaging.   EXAM:  CT CHEST, ABDOMEN, AND PELVIS WITH CONTRAST   TECHNIQUE:  Multidetector CT imaging of the chest, abdomen and pelvis was  performed following the standard protocol during bolus  administration of intravenous contrast.   CONTRAST: 80 cc Omnipaque 350   COMPARISON: Abdomen/pelvis CT 11/18/2019 chest CT 04/16/2018   FINDINGS:  CT CHEST FINDINGS   Cardiovascular: The heart size is normal. No substantial pericardial  effusion. Coronary artery calcification is evident. Atherosclerotic  calcification is noted inthe wall of the thoracic aorta.   Mediastinum/Nodes: 2.1 cm left thyroid nodule stable since chest CT  04/16/2018. No follow-up imaging recommended (ref: J Am Coll Radiol.  2015 Feb;12(2): 143-50). No mediastinal lymphadenopathy. There is no  hilar lymphadenopathy. The esophagus has normal  imaging features.  There is no axillary lymphadenopathy.   Lungs/Pleura: 4 mm right lower lobe nodule on 94/4 is stable since  2019. Tiny right upper lobe pulmonary nodule seen on images 55 and  66 are unchanged since 2019 consistent with benign etiology. No new  suspicious pulmonary nodule or mass. No focal airspace  consolidation. No pleural effusion.   Musculoskeletal: No worrisome lytic or sclerotic osseous  abnormality.   CT ABDOMEN PELVIS FINDINGS   Hepatobiliary: No suspicious focal abnormality within the liver  parenchyma. Tiny hypodensity in the central right liver is stable  consistent with benign etiology. There is no evidence for  gallstones, gallbladder wall thickening, or pericholecystic fluid.  No intrahepatic or extrahepatic biliary dilation.   Pancreas: No focal mass lesion. No dilatation of the main duct. No  intraparenchymal cyst. No peripancreatic edema.    Spleen: No splenomegaly. No focal mass lesion.   Adrenals/Urinary Tract: No adrenal nodule or mass. Right kidney  unremarkable. Stable appearance ablation defect lower interpolar  left kidney. No evidence for hydroureter. Mild circumferential  bladder wall thickening again noted, eccentric to the left.   Stomach/Bowel: Stomach is unremarkable. No gastric wall thickening.  No evidence of outlet obstruction. Duodenum is normally positioned  as is the ligament of Treitz. No small bowel wall thickening. No  small bowel dilatation. Rectal anastomosis is again noted with  sequelae of lowanterior section. Surgical bed is stable in  appearance in the interval. Colon is distended with stool.   Vascular/Lymphatic: There is abdominal aortic atherosclerosis  without aneurysm. There is no gastrohepatic or hepatoduodenal  ligament lymphadenopathy. No retroperitoneal or mesenteric  lymphadenopathy. No pelvic sidewall lymphadenopathy.   Reproductive: Prostate gland appears enlarged.   Other: No intraperitoneal free fluid.   Musculoskeletal: No worrisome lytic or sclerotic osseous  abnormality.   IMPRESSION:  1. Stable exam. No new or progressive findings to suggest recurrent  or metastatic disease in the chest, abdomen, or pelvis.  2. Stable appearance ablation defect lower interpolar left kidney.  3. Stable tiny right pulmonary nodules, consistent with benign  etiology.  4. Prostatomegaly with circumferential bladder wall thickening, more  prominent on the left.  5. Aortic Atherosclerosis (ICD10-I70.0).    Recent Labs: No results found for requested labs within last 8760 hours.  Recent Lipid Panel    Component Value Date/Time   CHOL CANCELED 10/14/2019 1020   TRIG CANCELED 10/14/2019 1020   HDL CANCELED 10/14/2019 1020   CHOLHDL 2.0 10/14/2019 0000   CHOLHDL 5.9 07/24/2019 0105   VLDL 39 07/24/2019 0105   LDLCALC 24 10/14/2019 0000    Physical Exam:    VS:  BP 108/78    Pulse 75   Ht 5\' 8"  (1.727 m)   Wt 175 lb 6.4 oz (79.6 kg)   SpO2 97%   BMI 26.67 kg/m     Wt Readings from Last 3 Encounters:  11/17/20 175 lb 6.4 oz (79.6 kg)  05/12/20 171 lb 3.2 oz (77.7 kg)  11/13/19 179 lb (81.2 kg)     GEN: Well nourished, well developed in no acute distress HEENT: Normal NECK: No JVD; No carotid bruits LYMPHATICS: No lymphadenopathy CARDIAC: S1S2 noted,RRR, no murmurs, rubs, gallops RESPIRATORY:  Clear to auscultation without rales, wheezing or rhonchi  ABDOMEN: Soft, non-tender, non-distended, +bowel sounds, no guarding. EXTREMITIES: No edema, No cyanosis, no clubbing MUSCULOSKELETAL:  No deformity  SKIN: Warm and dry NEUROLOGIC:  Alert and oriented x 3, non-focal PSYCHIATRIC:  Normal affect, good insight  ASSESSMENT:  1. Coronary artery disease involving native coronary artery of native heart, unspecified whether angina present   2. Hyperlipidemia, unspecified hyperlipidemia type   3. Ischemic cardiomyopathy   4. Thoracic aortic aneurysm without rupture ([place])    PLAN:    He is doing well from a cardiovascular standpoint.  He is off Brilinta he will remain on aspirin and rosuvastatin in terms of his coronary artery disease. We talked about his thoracic aneurysm which was 42 mm.  His recent CT scan reported this is stable.  He had questions about neck steps I advised the patient that repeating another testing in a year to assess for growth will be appropriate.  And then if the aneurysm increasing size close to 5 cm I referred him to vascular surgery, which I educated patient that ideally intervention usually takes place at 5.5 cm  The patient is in agreement with the above plan. The patient left the office in stable condition.  The patient will follow up in 1 year or sooner if needed.   Medication Adjustments/Labs and Tests Ordered: Current medicines are reviewed at length with the patient today.  Concerns regarding medicines are outlined above.   Orders Placed This Encounter  Procedures  . EKG 12-Lead   No orders of the defined types were placed in this encounter.   Patient Instructions  Medication Instructions:  Your physician recommends that you continue on your current medications as directed. Please refer to the Current Medication list given to you today.  *If you need a refill on your cardiac medications before your next appointment, please call your pharmacy*   Lab Work: None If you have labs (blood work) drawn today and your tests are completely normal, you will receive your results only by: Marland Kitchen MyChart Message (if you have MyChart) OR . A paper copy in the mail If you have any lab test that is abnormal or we need to change your treatment, we will call you to review the results.   Testing/Procedures: None   Follow-Up: At [facility], you and your health needs are our priority.  As part of our continuing mission to provide you with exceptional heart care, we have created designated Provider Care Teams.  These Care Teams include your primary Cardiologist (physician) and Advanced Practice Providers (APPs -  Physician Assistants and Nurse Practitioners) who all work together to provide you with the care you need, when you need it.  We recommend signing up for the patient portal called "MyChart".  Sign up information is provided on this After Visit Summary.  MyChart is used to connect with patients for Virtual Visits (Telemedicine).  Patients are able to view lab/test results, encounter notes, upcoming appointments, etc.  Non-urgent messages can be sent to your provider as well.   To learn more about what you can do with MyChart, go to [email or web address].    Your next appointment:   1 year(s)  The format for your next appointment:   In Person  Provider:   Berniece Salines, DO   Other Instructions      Adopting a Healthy Lifestyle.  Know what a healthy weight is for you (roughly BMI <25) and aim to  maintain this   Aim for 7+ servings of fruits and vegetables daily   65-80+ fluid ounces of water or unsweet tea for healthy kidneys   Limit to max 1 drink of alcohol per day; avoid smoking/tobacco   Limit animal fats in diet for cholesterol and heart health - choose grass fed  whenever available   Avoid highly processed foods, and foods high in saturated/trans fats   Aim for low stress - take time to unwind and care for your mental health   Aim for 150 min of moderate intensity exercise weekly for heart health, and weights twice weekly for bone health   Aim for 7-9 hours of sleep daily   When it comes to diets, agreement about the perfect plan isnt easy to find, even among the experts. Experts at the [employer] developed an idea known as the Healthy Eating Plate. Just imagine a plate divided into logical, healthy portions.   The emphasis is on diet quality:   Load up on vegetables and fruits - one-half of your plate: Aim for color and variety, and remember that potatoes dont count.   Go for whole grains - one-quarter of your plate: Whole wheat, barley, wheat berries, quinoa, oats, brown rice, and foods made with them. If you want pasta, go with whole wheat pasta.   Protein power - one-quarter of your plate: Fish, chicken, beans, and nuts are all healthy, versatile protein sources. Limit red meat.   The diet, however, does go beyond the plate, offering a few other suggestions.   Use healthy plant oils, such as olive, canola, soy, corn, sunflower and peanut. Check the labels, and avoid partially hydrogenated oil, which have unhealthy trans fats.   If youre thirsty, drink water. Coffee and tea are good in moderation, but skip sugary drinks and limit milk and dairy products to one or two daily servings.   The type of carbohydrate in the diet is more important than the amount. Some sources of carbohydrates, such as vegetables, fruits, whole grains, and beans-are  healthier than others.   Finally, stay active  Signed, Berniece Salines, DO  11/17/2020 9:07 AM    [place]

## 2020-11-17 NOTE — Patient Instructions (Signed)

## 2020-12-14 ENCOUNTER — Other Ambulatory Visit: Payer: Self-pay | Admitting: Interventional Radiology

## 2020-12-14 DIAGNOSIS — N2889 Other specified disorders of kidney and ureter: Secondary | ICD-10-CM

## 2021-01-14 ENCOUNTER — Encounter: Payer: Self-pay | Admitting: *Deleted

## 2021-01-14 ENCOUNTER — Other Ambulatory Visit: Payer: Self-pay

## 2021-01-14 ENCOUNTER — Ambulatory Visit
Admission: RE | Admit: 2021-01-14 | Discharge: 2021-01-14 | Disposition: A | Discharge status: Home / Self Care | Payer: Medicare PPO | Source: Ambulatory Visit | Attending: Interventional Radiology | Admitting: Interventional Radiology

## 2021-01-14 DIAGNOSIS — N2889 Other specified disorders of kidney and ureter: Secondary | ICD-10-CM

## 2021-01-14 HISTORY — PX: IR RADIOLOGIST EVAL & MGMT: IMG5224

## 2021-01-14 NOTE — Progress Notes (Signed)
Patient ID: Jonathon Oneill, male   DOB: 05/05/1946, 75 y.o.   MRN: [id number]        Chief Complaint:  Status post left renal cryoablation, 3-year follow-up, surveillance imaging  Referring Physician(s): Eske[place]  History of Present Illness: Jonathon Oneill is a 75 y.o. male with a remote history of colorectal cancer status post radiation, APR and chemotherapy.  He is now 3 years status post cryoablation of a solid enhancing 13 mm left renal mass/neoplasm.  He continues to do very well.  Excellent functional status.  No flank or abdominal pain.  No other urinary tract symptoms, hematuria or dysuria.  No recent illness or fever.  Surveillance imaging confirms a stable ablation defect.  No evidence of residual recurrent disease.  Past Medical History:  Diagnosis Date   Abdominal pain, acute, generalized 04/05/2017   Overview:  Added automatically from request for surgery [id number]  Formatting of this note might be different from the original. Added automatically from request for surgery [id number]   Abnormal CT of the abdomen 04/05/2017   Overview:  Added automatically from request for surgery [id number]  Formatting of this note might be different from the original. Added automatically from request for surgery [id number]   Allergy    CAD (coronary artery disease), native coronary artery 07/24/2019   LHC 07/24/19: DES-pLAD, DES-dRCA   Cancer ([place]) 04/2017   rectal cancer   Cataract    Chronic kidney disease 04/2017   left renal mass   Chronic systolic heart failure ([place])    Encounter for antineoplastic chemotherapy 05/03/2017   Epiretinal membrane (ERM) of left eye 05/09/2013   Glaucoma    Ischemic cardiomyopathy    35-45% on [facility] 07/24/19   Lamellar macular hole, left eye 05/09/2013   Left renal mass 04/05/2017   Mass of left thigh 03/25/2019   Mixed hyperlipidemia 08/02/2019   Multiple thyroid nodules 06/02/2017   Non-ST elevation (NSTEMI) myocardial infarction ([place]) 07/23/2019   Nontoxic single thyroid  nodule 06/14/2017   Nuclear sclerotic cataract of left eye 01/24/2013   Obesity    Obesity, unspecified 05/21/2012   Primary open-angle glaucoma 04/16/2012   Pseudophakia 09/13/2012   Overview:  S/p YAG CAPS OD ~ 11/2012  Formatting of this note might be different from the original. 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   Retinal break of left eye 04/30/2012   Thyroid nodule 04/2017   multiple    Past Surgical History:  Procedure Laterality Date   COLON SURGERY  08/2017   COLONOSCOPY  04/2017   CORONARY STENT INTERVENTION N/A 07/24/2019   Procedure: CORONARY STENT INTERVENTION;  Surgeon: Jettie Booze, MD;  Location: [place] [facility];  Service: Cardiovascular;  Laterality: N/A;   EYE SURGERY     POAG; lamellar macular hole on left; retinal break of left eye; nuclear sclerotic cataract left eye   FINGER SURGERY     IR RADIOLOGIST EVAL & MGMT  06/15/2017   IR RADIOLOGIST EVAL & MGMT  11/28/2017   IR RADIOLOGIST EVAL & MGMT  01/10/2018   IR RADIOLOGIST EVAL & MGMT  04/17/2018   IR RADIOLOGIST EVAL & MGMT  12/05/2019   KIDNEY SURGERY     LEFT HEART CATH AND CORONARY ANGIOGRAPHY N/A 07/24/2019   Procedure: LEFT HEART CATH AND CORONARY ANGIOGRAPHY;  Surgeon: Jettie Booze,  MD;  Location: [place] [facility];  Service: Cardiovascular;  Laterality: N/A;    Allergies: Influenza vaccines and Ciprofloxacin  Medications: Prior to Admission medications   Medication Sig Start Date End Date Taking? Authorizing Provider  aspirin EC 81 MG tablet Take 1 tablet (81 mg total) by mouth daily. 07/23/19   Tobb, Kardie, DO  Cholecalciferol (VITAMIN D3) 2000 units capsule Take 2,000 Units by mouth at bedtime.     [provider]  latanoprost (XALATAN) 0.005 % ophthalmic solution Place 1 drop into both eyes at bedtime.  03/21/16   [provider]  nitroGLYCERIN (NITROSTAT) 0.4 MG SL tablet Place 1 tablet (0.4 mg total) under the tongue every 5 (five) minutes x 3 doses as needed for chest pain. 07/25/19   [facility], Tami Lin, PA  rosuvastatin (CRESTOR) 10 MG tablet Take 1 tablet (10 mg total) by mouth daily. 08/11/20 11/09/20  Tobb, Kardie, DO  TURMERIC PO Take 1 tablet by mouth daily.    [provider]     Family History  Problem Relation Age of Onset   Colon cancer Mother    Colon polyps Mother    Rectal cancer Mother    Esophageal cancer Maternal Great-grandfather    Stomach cancer Neg Hx     Social History   Socioeconomic History   Marital status: Married    Spouse name: Not on file   Number of children: 4   Years of education: Not on file   Highest education level: Not on file  Occupational History   Occupation: Retired   Tobacco Use   Smoking status: Never   Smokeless tobacco: Never  Vaping Use   Vaping Use: Never used  Substance and Sexual Activity   Alcohol use: Not Currently   Drug use: Never   Sexual activity: Not on file  Other Topics Concern   Not on file  Social History Narrative   Not on file   Social Determinants of Health   Financial Resource Strain: Not on file  Food Insecurity: Not on file  Transportation Needs: Not on file  Physical Activity: Not on file  Stress: Not on file  Social Connections: Not on file    ECOG Status: 1 - Symptomatic but completely ambulatory  Review of Systems  Review of Systems: A 12 point ROS discussed and pertinent positives are indicated in the HPI above.  All other systems are negative.  Physical Exam No direct physical exam was performed, telephone health visit only today because of COVID pandemic Vital Signs: There were no vitals taken for this visit.  Imaging: No results found.  Labs:  CBC: No results for input(s): WBC, HGB, HCT, PLT in the last 8760 hours.  COAGS: No results for input(s): INR, APTT in the last 8760  hours.  BMP: No results for input(s): NA, K, CL, CO2, GLUCOSE, BUN, CALCIUM, CREATININE, GFRNONAA, GFRAA in the last 8760 hours.  Invalid input(s): CMP  LIVER FUNCTION TESTS: No results for input(s): BILITOT, AST, ALT, ALKPHOS, PROT, ALBUMIN in the last 8760 hours.   Assessment and Plan:  3 years status post left renal cryoablation for a solid enhancing left renal neoplasm/mass.  Surveillance imaging performed 11/10/2020 at [facility] demonstrates a stable ablation defect.  No signs of residual or recurrent disease.  No new renal abnormality.  Plan: Continue annual surveillance imaging with CT for total of 5 years.   Electronically Signed: Greggory Keen 01/14/2021, 8:50 AM   I spent a total of  31 Minutes in remote  clinical consultation, greater than 50% of which was counseling/coordinating care for this patient status post left renal cryoablation.    Visit type: Audio only (telephone). Audio (no video) only due to patient's lack of internet/smartphone capability. Alternative for in-person consultation at [facility], [address], [place], [place]. This visit type was conducted due to national recommendations for restrictions regarding the COVID-19 Pandemic (e.g. social distancing).  This format is felt to be most appropriate for this patient at this time.  All issues noted in this document were discussed and addressed.

## 2021-03-09 ENCOUNTER — Other Ambulatory Visit: Payer: Self-pay | Admitting: Cardiology

## 2021-04-01 ENCOUNTER — Other Ambulatory Visit: Payer: Self-pay

## 2021-04-01 MED ORDER — ROSUVASTATIN CALCIUM 10 MG PO TABS: 10.0000 mg | ORAL_TABLET | Freq: Every day | ORAL | 3 refills | Status: DC

## 2021-04-01 NOTE — Telephone Encounter (Signed)
Refill sent to pharmacy.   

## 2021-04-10 ENCOUNTER — Other Ambulatory Visit: Payer: Self-pay | Admitting: Cardiology

## 2021-07-27 ENCOUNTER — Encounter: Payer: Self-pay | Admitting: Gastroenterology

## 2021-09-02 ENCOUNTER — Other Ambulatory Visit: Payer: Self-pay

## 2021-09-02 ENCOUNTER — Ambulatory Visit (AMBULATORY_SURGERY_CENTER): Payer: Medicare PPO | Admitting: *Deleted

## 2021-09-02 VITALS — Ht 68.0 in | Wt 176.0 lb

## 2021-09-02 DIAGNOSIS — Z85048 Personal history of other malignant neoplasm of rectum, rectosigmoid junction, and anus: Secondary | ICD-10-CM

## 2021-09-02 DIAGNOSIS — Z800 Family history of malignant neoplasm of digestive organs: Secondary | ICD-10-CM

## 2021-09-02 DIAGNOSIS — Z86010 Personal history of colonic polyps: Secondary | ICD-10-CM

## 2021-09-02 NOTE — Progress Notes (Signed)
No egg or soy allergy known to patient  °No issues known to pt with past sedation with any surgeries or procedures °Patient denies ever being told they had issues or difficulty with intubation  °No FH of Malignant Hyperthermia °Pt is not on diet pills °Pt is not on  home 02  °Pt is not on blood thinners  °Pt denies issues with constipation  °No A fib or A flutter ° °Pt is not vaccinated  for Covid  ° °Due to the COVID-19 pandemic we are asking patients to follow certain guidelines in PV and the [facility]   °Pt aware of COVID protocols and [facility] guidelines  ° °PV completed over the phone. Pt verified name, DOB, address and insurance during PV today.  °Pt mailed instruction packet with copy of consent form to read and not return, and instructions.  ° °Pt encouraged to call with questions or issues.  °If pt has My chart, procedure instructions sent via My Chart  ° °

## 2021-09-13 ENCOUNTER — Encounter: Payer: Self-pay | Admitting: Gastroenterology

## 2021-09-16 ENCOUNTER — Encounter: Payer: Self-pay | Admitting: Gastroenterology

## 2021-09-16 ENCOUNTER — Other Ambulatory Visit: Payer: Self-pay | Admitting: Gastroenterology

## 2021-09-16 ENCOUNTER — Ambulatory Visit (AMBULATORY_SURGERY_CENTER): Payer: Medicare PPO | Admitting: Gastroenterology

## 2021-09-16 ENCOUNTER — Other Ambulatory Visit: Payer: Self-pay

## 2021-09-16 VITALS — BP 96/64 | HR 66 | Temp 98.1°F | Resp 12 | Ht 68.0 in | Wt 176.0 lb

## 2021-09-16 DIAGNOSIS — Z85048 Personal history of other malignant neoplasm of rectum, rectosigmoid junction, and anus: Secondary | ICD-10-CM

## 2021-09-16 DIAGNOSIS — Z800 Family history of malignant neoplasm of digestive organs: Secondary | ICD-10-CM | POA: Diagnosis not present

## 2021-09-16 DIAGNOSIS — D123 Benign neoplasm of transverse colon: Secondary | ICD-10-CM | POA: Diagnosis not present

## 2021-09-16 DIAGNOSIS — K627 Radiation proctitis: Secondary | ICD-10-CM

## 2021-09-16 DIAGNOSIS — K6289 Other specified diseases of anus and rectum: Secondary | ICD-10-CM | POA: Diagnosis not present

## 2021-09-16 DIAGNOSIS — Z86010 Personal history of colonic polyps: Secondary | ICD-10-CM | POA: Diagnosis not present

## 2021-09-16 DIAGNOSIS — K621 Rectal polyp: Secondary | ICD-10-CM | POA: Diagnosis not present

## 2021-09-16 MED ORDER — SODIUM CHLORIDE 0.9 % IV SOLN: 500.0000 mL | Freq: Once | INTRAVENOUS | Status: DC

## 2021-09-16 NOTE — Progress Notes (Signed)
Vss nad trans to pacu °

## 2021-09-16 NOTE — Op Note (Signed)
[place] ?Patient Name: Jonathon Oneill ?Procedure Date: 09/16/2021 9:07 AM ?MRN: [id number] ?Endoscopist: Jackquline Denmark , MD ?Age: 76 ?Referring MD:  ?Date of Birth: [birth year] ?Gender: Male ?Account #: [id number] ?Procedure:                Colonoscopy ?Indications:              #1. H/O Rectal cancer (stage II) s/p neo-adjuvant  ?                          chemo-XRT followed by LAR 08/2017 with diverting  ?                          colostomy, followed by takedown of colostomy May  ?                          2019 at [facility] (Dr Christ Kick). ?                          #2. FH colon cancer (mom at age 28). ?                          #3. H/O polyps. ?Medicines:                Monitored Anesthesia Care ?Procedure:                Pre-Anesthesia Assessment: ?                          - Prior to the procedure, a History and Physical  ?                          was performed, and patient medications and  ?                          allergies were reviewed. The patient's tolerance of  ?                          previous anesthesia was also reviewed. The risks  ?                          and benefits of the procedure and the sedation  ?                          options and risks were discussed with the patient.  ?                          All questions were answered, and informed consent  ?                          was obtained. Prior Anticoagulants: The patient has  ?                          taken no previous anticoagulant or antiplatelet  ?  agents. ASA Grade Assessment: III - A patient with  ?                          severe systemic disease. After reviewing the risks  ?                          and benefits, the patient was deemed in  ?                          satisfactory condition to undergo the procedure. ?                          After obtaining informed consent, the colonoscope  ?                          was passed under direct vision. Throughout the  ?                           procedure, the patient's blood pressure, pulse, and  ?                          oxygen saturations were monitored continuously. The  ?                          CF [id number] #[id number] was introduced through the anus  ?                          and advanced to the the cecum, identified by  ?                          appendiceal orifice and ileocecal valve. The  ?                          colonoscopy was performed without difficulty. The  ?                          patient tolerated the procedure well. The quality  ?                          of the bowel preparation was adequate to identify  ?                          polyps 6 mm and larger in size. The ileocecal  ?                          valve, appendiceal orifice, and rectum were  ?                          photographed. ?Scope In: 9:15:53 AM ?Scope Out: 9:29:30 AM ?Scope Withdrawal Time: 0 hours 10 minutes 9 seconds  ?Total Procedure Duration: 0 hours 13 minutes 37 seconds  ?Findings:                 A 2 mm polyp was found in the proximal  transverse  ?                          colon. The polyp was sessile. The polyp was removed  ?                          with a cold snare. Resection and retrieval were  ?                          complete. ?                          There was evidence of a prior end-to-end  ?                          low-anterior anastomosis in the distal rectum,  ?                          almost at the dentate line with a small granulation  ?                          tissue (likely, doubt recurrence). Multiple  ?                          biopsies were obtained and sent for histology. ?                          A few medium-mouthed diverticula were found in the  ?                          sigmoid colon. 2 diverticula were noted right at  ?                          the anastomosis. ?                          The exam was otherwise without abnormality.  ?                          Retroflexed examination was not performed  ?                           intentionally d/t rectal scarring. ?                          The preparation was fair. There was retained  ?                          adherent stool in some solid vegetable material  ?                          throughout the colon limiting examination.  ?                          Aggressive suctioning and aspiration was performed.  ?  Overall 70-75% of colonic mucosa was visualized  ?                          satisfactorily. ?Complications:            No immediate complications. ?Estimated Blood Loss:     Estimated blood loss: none. ?Impression:               - One 2 mm polyp in the proximal transverse colon,  ?                          removed with a cold snare. Resected and retrieved. ?                          - Patent end-to-end low-anterior anastomosis,  ?                          characterized by erosion/granulation tissue.  ?                          Biopsied. ?                          - Mild sigmoid diverticulosis ?                          - The examination was otherwise normal. ?Recommendation:           - Patient has a contact number available for  ?                          emergencies. The signs and symptoms of potential  ?                          delayed complications were discussed with the  ?                          patient. Return to normal activities tomorrow.  ?                          Written discharge instructions were provided to the  ?                          patient. ?                          - Resume previous diet. ?                          - Continue present medications. ?                          - Await pathology results. ?                          - Repeat colonoscopy for surveillance based on  ?  pathology results. ?                          - The findings and recommendations were discussed  ?                          with the patient's family. ?Jackquline Denmark, MD ?09/16/2021 9:39:08 AM ?This report has been signed electronically. ?

## 2021-09-16 NOTE — Progress Notes (Signed)
? ? ?Chief Complaint: For colonoscopy ? ?Referring Provider:  Nicoletta Dress, MD    ? ? ?ASSESSMENT AND PLAN;  ? ?#1. H/O Rectal cancer (stage II) s/p neo-adjuvant chemo-XRT followed by LAR 08/2017 with diverting colostomy, followed by takedown of colostomy May 2019 at [facility] (Dr Christ Kick). Neg CT 04/2018 for recurrence. Followed by Dr Pacholke/Dr Cruzita Lederer. Nl CEA level 01/2018. ?#2. FH colon cancer (mom at age 75). ?#3. H/O Polyps (TAs) on index colonoscopy 04/2017. ?#4. Left renal mass s/p cryoablation under CT guidance (Dr Reesa Chew). ? ?Plan: ?- Proceed with colonoscopy.  I have discussed the risks and benefits.  The risks including risk of perforation requiring laparotomy, bleeding after polypectomy requiring blood transfusions and risks of anesthesia/sedation were discussed. Consent forms were given for review. ?-He will follow-up regularly for FU CTs and CEA level with Dr. Cruzita Lederer. ? ? ? ?HPI:   ? ?Jonathon Oneill is a 76 y.o. male  ?No nausea, vomiting, heartburn, regurgitation, odynophagia or dysphagia.  No significant diarrhea or constipation.  There is no melena or hematochezia (had some, better with prep-H). No unintentional weight loss. ?Here to get repeat colonoscopy. ? ?Daughter is a patient of ours as well. ?Past Medical History:  ?Diagnosis Date  ? Abdominal pain, acute, generalized 04/05/2017  ? Overview:  Added automatically from request for surgery [id number]  Formatting of this note might be different from the original. Added automatically from request for surgery [id number]  ? Abnormal CT of the abdomen 04/05/2017  ? Overview:  Added automatically from request for surgery [id number]  Formatting of this note might be different from the original. Added automatically from request for surgery [id number]  ? Allergy   ? CAD (coronary artery disease), native coronary artery 07/24/2019  ? LHC 07/24/19: DES-pLAD, DES-dRCA  ? Cancer ([place]) 04/2017  ? rectal cancer. kidney cancer  ? Cataract   ? removed right eye, left eye  forming  ? Chronic kidney disease 04/2017  ? left renal mass  ? Chronic systolic heart failure ([place])   ? Encounter for antineoplastic chemotherapy 05/03/2017  ? Epiretinal membrane (ERM) of left eye 05/09/2013  ? Glaucoma   ? History of radiation therapy 2018  ? ended 06-2017  ? Ischemic cardiomyopathy   ? 35-45% on [facility] 07/24/19  ? Lamellar macular hole, left eye 05/09/2013  ? Left renal mass 04/05/2017  ? Mass of left thigh 03/25/2019  ? Mixed hyperlipidemia 08/02/2019  ? Multiple thyroid nodules 06/02/2017  ? Non-ST elevation (NSTEMI) myocardial infarction ([place]) 07/23/2019  ? Nontoxic single thyroid nodule 06/14/2017  ? Nuclear sclerotic cataract of left eye 01/24/2013  ? Obesity   ? Obesity, unspecified 05/21/2012  ? Primary open-angle glaucoma 04/16/2012  ? Pseudophakia 09/13/2012  ? Overview:  S/p YAG CAPS OD ~ 11/2012  Formatting of this note might be different from the original. Overview:  S/p YAG CAPS OD ~ 11/2012 Overview:  Overview:  S/p YAG CAPS OD ~ 11/2012  ? Pseudophakia of right eye 11/2012  ? Pulmonary nodules 04/2017  ? Rectal carcinoma ([place]) 04/18/2017  ? Retinal break of left eye 04/30/2012  ? Thyroid nodule 04/2017  ? multiple  ? ? ?Past Surgical History:  ?Procedure Laterality Date  ? COLON SURGERY  08/2017  ? COLONOSCOPY  04/2017  ? CORONARY STENT INTERVENTION N/A 07/24/2019  ? Procedure: CORONARY STENT INTERVENTION;  Surgeon: Jettie Booze, MD;  Location: [place] [facility];  Service: Cardiovascular;  Laterality: N/A;  ? EYE SURGERY    ?  POAG; lamellar macular hole on left; retinal break of left eye; nuclear sclerotic cataract left eye  ? FINGER SURGERY    ? IR RADIOLOGIST EVAL & MGMT  06/15/2017  ? IR RADIOLOGIST EVAL & MGMT  11/28/2017  ? IR RADIOLOGIST EVAL & MGMT  01/10/2018  ? IR RADIOLOGIST EVAL & MGMT  04/17/2018  ? IR RADIOLOGIST EVAL & MGMT  12/05/2019  ? IR RADIOLOGIST EVAL & MGMT  01/14/2021  ? KIDNEY SURGERY    ? LEFT HEART CATH AND CORONARY ANGIOGRAPHY N/A 07/24/2019  ? Procedure:  LEFT HEART CATH AND CORONARY ANGIOGRAPHY;  Surgeon: Jettie Booze, MD;  Location: [place] [facility];  Service: Cardiovascular;  Laterality: N/A;  ? ? ?Family History  ?Problem Relation Age of Onset  ? Colon cancer Mother   ? Colon polyps Mother   ? Rectal cancer Mother   ? Esophageal cancer Maternal Great-grandfather   ? Stomach cancer Neg Hx   ? ? ?Social History  ? ?Tobacco Use  ? Smoking status: Never  ? Smokeless tobacco: Never  ?Vaping Use  ? Vaping Use: Never used  ?Substance Use Topics  ? Alcohol use: Not Currently  ? Drug use: Never  ? ? ?Current Outpatient Medications  ?Medication Sig Dispense Refill  ? aspirin EC 81 MG tablet Take 1 tablet (81 mg total) by mouth daily. 90 tablet 3  ? Cholecalciferol (VITAMIN D3) 2000 units capsule Take 2,000 Units by mouth at bedtime.     ? latanoprost (XALATAN) 0.005 % ophthalmic solution Place 1 drop into both eyes at bedtime.     ? Omega-3 Fatty Acids (FISH OIL) 1200 MG CAPS Take by mouth daily.    ? rosuvastatin (CRESTOR) 20 MG tablet TAKE 1 TABLET BY MOUTH EVERY DAY (Patient taking differently: Take 10 mg by mouth daily.) 90 tablet 1  ? nitroGLYCERIN (NITROSTAT) 0.4 MG SL tablet Place 1 tablet (0.4 mg total) under the tongue every 5 (five) minutes x 3 doses as needed for chest pain. 25 tablet 3  ? ?Current Facility-Administered Medications  ?Medication Dose Route Frequency Provider Last Rate Last Admin  ? 0.9 %  sodium chloride infusion  500 mL Intravenous Once Jackquline Denmark, MD      ? ? ?Allergies  ?Allergen Reactions  ? Amoxicillin Diarrhea  ? Influenza Vaccines Nausea Only  ? Ciprofloxacin Rash  ? ? ?Review of Systems:  ?Constitutional: Denies fever, chills, diaphoresis, appetite change and fatigue.  ?HEENT: Denies photophobia, eye pain, redness, hearing loss, ear pain, congestion, sore throat, rhinorrhea, sneezing, mouth sores, neck pain, neck stiffness and tinnitus.   ?Respiratory: Denies SOB, DOE, cough, chest tightness,  and wheezing.   ?Cardiovascular:  Denies chest pain, palpitations and leg swelling.  ?Genitourinary: Denies dysuria, urgency, frequency, hematuria, flank pain and difficulty urinating.  ?Musculoskeletal: Denies myalgias, back pain, joint swelling, arthralgias and gait problem.  ?Skin: No rash.  ?Neurological: Denies dizziness, seizures, syncope, weakness, light-headedness, numbness and headaches.  ?Hematological: Denies adenopathy. Easy bruising, personal or family bleeding history  ?Psychiatric/Behavioral: No anxiety or depression ? ?  ? ?Physical Exam:   ? ?BP 106/78   Pulse 68   Temp 98.1 ?F (36.7 ?C) (Temporal)   Ht '5\' 8"'$  (1.727 m)   Wt 176 lb (79.8 kg)   SpO2 100%   BMI 26.76 kg/m?  ?Filed Weights  ? 09/16/21 0843  ?Weight: 176 lb (79.8 kg)  ? ?Constitutional:  Well-developed, in no acute distress. ?Psychiatric: Normal mood and affect. Behavior is normal. ?HEENT: Pupils  normal.  Conjunctivae are normal. No scleral icterus. ?Neck supple.  ?Cardiovascular: Normal rate, regular rhythm. No edema ?Pulmonary/chest: Effort normal and breath sounds normal. No wheezing, rales or rhonchi. ?Abdominal: Soft, nondistended. Nontender. Bowel sounds active throughout. There are no masses palpable. No hepatomegaly.  Well-healed surgical scars.  Some divarication of recti.  No definite hernias. ?Rectal: To be performed at the time of colonoscopy. ?Neurological: Alert and oriented to person place and time. ?Skin: Skin is warm and dry. No rashes noted. ? ? ?Carmell Austria, MD 09/16/2021, 9:09 AM ? ?Cc: Dr. Cruzita Lederer ? ? ?

## 2021-09-16 NOTE — Progress Notes (Signed)
Pt's states no medical or surgical changes since previsit or office visit.  VS CW  

## 2021-09-16 NOTE — Patient Instructions (Signed)
YOU HAD AN ENDOSCOPIC PROCEDURE TODAY AT THE [facility]:   Refer to the procedure report that was given to you for any specific questions about what was found during the examination.  If the procedure report does not answer your questions, please call your gastroenterologist to clarify.  If you requested that your care partner not be given the details of your procedure findings, then the procedure report has been included in a sealed envelope for you to review at your convenience later.  YOU SHOULD EXPECT: Some feelings of bloating in the abdomen. Passage of more gas than usual.  Walking can help get rid of the air that was put into your GI tract during the procedure and reduce the bloating. If you had a lower endoscopy (such as a colonoscopy or flexible sigmoidoscopy) you may notice spotting of blood in your stool or on the toilet paper. If you underwent a bowel prep for your procedure, you may not have a normal bowel movement for a few days.  Please Note:  You might notice some irritation and congestion in your nose or some drainage.  This is from the oxygen used during your procedure.  There is no need for concern and it should clear up in a day or so.  SYMPTOMS TO REPORT IMMEDIATELY:   Following lower endoscopy (colonoscopy or flexible sigmoidoscopy):  Excessive amounts of blood in the stool  Significant tenderness or worsening of abdominal pains  Swelling of the abdomen that is new, acute  Fever of 100F or higher  For urgent or emergent issues, a gastroenterologist can be reached at any hour by calling [phone]. Do not use MyChart messaging for urgent concerns.    DIET:  We do recommend a small meal at first, but then you may proceed to your regular diet.  Drink plenty of fluids but you should avoid alcoholic beverages for 24 hours.  ACTIVITY:  You should plan to take it easy for the rest of today and you should NOT DRIVE or use heavy machinery until tomorrow (because  of the sedation medicines used during the test).    FOLLOW UP: Our staff will call the number listed on your records 48-72 hours following your procedure to check on you and address any questions or concerns that you may have regarding the information given to you following your procedure. If we do not reach you, we will leave a message.  We will attempt to reach you two times.  During this call, we will ask if you have developed any symptoms of COVID 19. If you develop any symptoms (ie: fever, flu-like symptoms, shortness of breath, cough etc.) before then, please call [phone].  If you test positive for Covid 19 in the 2 weeks post procedure, please call and report this information to us.    If any biopsies were taken you will be contacted by phone or by letter within the next 1-3 weeks.  Please call us at [phone] if you have not heard about the biopsies in 3 weeks.    SIGNATURES/CONFIDENTIALITY: You and/or your care partner have signed paperwork which will be entered into your electronic medical record.  These signatures attest to the fact that that the information above on your After Visit Summary has been reviewed and is understood.  Full responsibility of the confidentiality of this discharge information lies with you and/or your care-partner. 

## 2021-09-16 NOTE — Progress Notes (Signed)
Called to room to assist during endoscopic procedure.  Patient ID and intended procedure confirmed with present staff. Received instructions for my participation in the procedure from the performing physician.  

## 2021-09-16 NOTE — Progress Notes (Deleted)
YOU HAD AN ENDOSCOPIC PROCEDURE TODAY AT [facility]:   Refer to the procedure report that was given to you for any specific questions about what was found during the examination.  If the procedure report does not answer your questions, please call your gastroenterologist to clarify.  If you requested that your care partner not be given the details of your procedure findings, then the procedure report has been included in a sealed envelope for you to review at your convenience later. ? ?YOU SHOULD EXPECT: Some feelings of bloating in the abdomen. Passage of more gas than usual.  Walking can help get rid of the air that was put into your GI tract during the procedure and reduce the bloating. If you had a lower endoscopy (such as a colonoscopy or flexible sigmoidoscopy) you may notice spotting of blood in your stool or on the toilet paper. If you underwent a bowel prep for your procedure, you may not have a normal bowel movement for a few days. ? ?Please Note:  You might notice some irritation and congestion in your nose or some drainage.  This is from the oxygen used during your procedure.  There is no need for concern and it should clear up in a day or so. ? ?SYMPTOMS TO REPORT IMMEDIATELY: ? ?Following lower endoscopy (colonoscopy or flexible sigmoidoscopy): ? Excessive amounts of blood in the stool ? Significant tenderness or worsening of abdominal pains ? Swelling of the abdomen that is new, acute ? Fever of 100?F or higher ? ?Following upper endoscopy (EGD) ? Vomiting of blood or coffee ground material ? New chest pain or pain under the shoulder blades ? Painful or persistently difficult swallowing ? New shortness of breath ? Fever of 100?F or higher ? Black, tarry-looking stools ? ?For urgent or emergent issues, a gastroenterologist can be reached at any hour by calling [phone]. ?Do not use MyChart messaging for urgent concerns.  ? ? ?DIET:  We do recommend a small meal at first, but  then you may proceed to your regular diet.  Drink plenty of fluids but you should avoid alcoholic beverages for 24 hours. ? ?ACTIVITY:  You should plan to take it easy for the rest of today and you should NOT DRIVE or use heavy machinery until tomorrow (because of the sedation medicines used during the test).   ? ?FOLLOW UP: ?Our staff will call the number listed on your records 48-72 hours following your procedure to check on you and address any questions or concerns that you may have regarding the information given to you following your procedure. If we do not reach you, we will leave a message.  We will attempt to reach you two times.  During this call, we will ask if you have developed any symptoms of COVID 19. If you develop any symptoms (ie: fever, flu-like symptoms, shortness of breath, cough etc.) before then, please call [phone].  If you test positive for Covid 19 in the 2 weeks post procedure, please call and report this information to [place].   ? ?If any biopsies were taken you will be contacted by phone or by letter within the next 1-3 weeks.  Please call us at [phone] if you have not heard about the biopsies in 3 weeks.  ? ? ?SIGNATURES/CONFIDENTIALITY: ?You and/or your care partner have signed paperwork which will be entered into your electronic medical record.  These signatures attest to the fact that that the information above on your After  Visit Summary has been reviewed and is understood.  Full responsibility of the confidentiality of this discharge information lies with you and/or your care-partner. YOU HAD AN ENDOSCOPIC PROCEDURE TODAY AT [facility]:   Refer to the procedure report that was given to you for any specific questions about what was found during the examination.  If the procedure report does not answer your questions, please call your gastroenterologist to clarify.  If you requested that your care partner not be given the details of your procedure findings,  then the procedure report has been included in a sealed envelope for you to review at your convenience later. ? ?YOU SHOULD EXPECT: Some feelings of bloating in the abdomen. Passage of more gas than usual.  Walking can help get rid of the air that was put into your GI tract during the procedure and reduce the bloating. If you had a lower endoscopy (such as a colonoscopy or flexible sigmoidoscopy) you may notice spotting of blood in your stool or on the toilet paper. If you underwent a bowel prep for your procedure, you may not have a normal bowel movement for a few days. ? ?Please Note:  You might notice some irritation and congestion in your nose or some drainage.  This is from the oxygen used during your procedure.  There is no need for concern and it should clear up in a day or so. ? ?SYMPTOMS TO REPORT IMMEDIATELY: ? ?Following lower endoscopy (colonoscopy or flexible sigmoidoscopy): ? Excessive amounts of blood in the stool ? Significant tenderness or worsening of abdominal pains ? Swelling of the abdomen that is new, acute ? Fever of 100?F or higher ? ?Following upper endoscopy (EGD) ? Vomiting of blood or coffee ground material ? New chest pain or pain under the shoulder blades ? Painful or persistently difficult swallowing ? New shortness of breath ? Fever of 100?F or higher ? Black, tarry-looking stools ? ?For urgent or emergent issues, a gastroenterologist can be reached at any hour by calling [phone]. ?Do not use MyChart messaging for urgent concerns.  ? ? ?DIET:  We do recommend a small meal at first, but then you may proceed to your regular diet.  Drink plenty of fluids but you should avoid alcoholic beverages for 24 hours. ? ?ACTIVITY:  You should plan to take it easy for the rest of today and you should NOT DRIVE or use heavy machinery until tomorrow (because of the sedation medicines used during the test).   ? ?FOLLOW UP: ?Our staff will call the number listed on your records 48-72 hours  following your procedure to check on you and address any questions or concerns that you may have regarding the information given to you following your procedure. If we do not reach you, we will leave a message.  We will attempt to reach you two times.  During this call, we will ask if you have developed any symptoms of COVID 19. If you develop any symptoms (ie: fever, flu-like symptoms, shortness of breath, cough etc.) before then, please call [phone].  If you test positive for Covid 19 in the 2 weeks post procedure, please call and report this information to [place].   ? ?If any biopsies were taken you will be contacted by phone or by letter within the next 1-3 weeks.  Please call us at [phone] if you have not heard about the biopsies in 3 weeks.  ? ? ?SIGNATURES/CONFIDENTIALITY: ?You and/or your care partner have signed  paperwork which will be entered into your electronic medical record.  These signatures attest to the fact that that the information above on your After Visit Summary has been reviewed and is understood.  Full responsibility of the confidentiality of this discharge information lies with you and/or your care-partner.  ?

## 2021-09-20 ENCOUNTER — Telehealth: Payer: Self-pay

## 2021-09-20 NOTE — Telephone Encounter (Signed)
?  Follow up Call- ? ?Call back number 09/16/2021  ?Post procedure Call Back phone  # [phone]  ?Permission to leave phone message Yes  ?Some recent data might be hidden  ?  ? ?Patient questions: ? ?Do you have a fever, pain , or abdominal swelling? No. ?Pain Score  0 * ? ?Have you tolerated food without any problems? Yes.   ? ?Have you been able to return to your normal activities? Yes.   ? ?Do you have any questions about your discharge instructions: ?Diet   No. ?Medications  No. ?Follow up visit  No. ? ?Do you have questions or concerns about your Care? No. ? ?Actions: ?* If pain score is 4 or above: ?No action needed, pain <4. ? ? ?

## 2021-09-20 NOTE — Telephone Encounter (Signed)
Left message on follow up call. 

## 2021-09-29 ENCOUNTER — Encounter: Payer: Self-pay | Admitting: Gastroenterology

## 2021-12-10 ENCOUNTER — Other Ambulatory Visit: Payer: Self-pay | Admitting: Interventional Radiology

## 2021-12-10 DIAGNOSIS — N2889 Other specified disorders of kidney and ureter: Secondary | ICD-10-CM

## 2021-12-13 ENCOUNTER — Encounter: Payer: Self-pay | Admitting: *Deleted

## 2021-12-13 ENCOUNTER — Ambulatory Visit
Admission: RE | Admit: 2021-12-13 | Discharge: 2021-12-13 | Disposition: A | Discharge status: Home / Self Care | Payer: Medicare PPO | Source: Ambulatory Visit | Attending: Interventional Radiology | Admitting: Interventional Radiology

## 2021-12-13 DIAGNOSIS — N2889 Other specified disorders of kidney and ureter: Secondary | ICD-10-CM

## 2021-12-13 HISTORY — PX: IR RADIOLOGIST EVAL & MGMT: IMG5224

## 2021-12-13 NOTE — Progress Notes (Signed)
Patient ID: Jonathon Oneill, male   DOB: June 20, 1946, 76 y.o.   MRN: [id number]       Chief Complaint:  Status post left renal cryoablation, 4-year follow-up, surveillance imaging  Referring Physician(s): Es[place]w  History of Present Illness: Jonathon Oneill is a 76 y.o. male who has a remote history of colorectal cancer status post radiation, APR, and chemotherapy.  He is now 4 years status post cryoablation of a solid enhancing 13 mm left renal mass/neoplasm.  Overall he continues to do very well.  Excellent functional status.  No urinary tract symptoms, flank pain, abdominal pain, hematuria, or dysuria.  No recent illness or fevers.  He remains active and busy daily.  Stable weight and appetite.  Surveillance imaging performed at [facility] Nov 11, 2021.  This demonstrates a stable ablation defect.  No evidence of residual or recurrent disease.  No new renal abnormality.  Past Medical History:  Diagnosis Date   Abdominal pain, acute, generalized 04/05/2017   Overview:  Added automatically from request for surgery [id number]  Formatting of this note might be different from the original. Added automatically from request for surgery [id number]   Abnormal CT of the abdomen 04/05/2017   Overview:  Added automatically from request for surgery [id number]  Formatting of this note might be different from the original. Added automatically from request for surgery [id number]   Allergy    CAD (coronary artery disease), native coronary artery 07/24/2019   LHC 07/24/19: DES-pLAD, DES-dRCA   Cancer ([place]) 04/2017   rectal cancer. kidney cancer   Cataract    removed right eye, left eye forming   Chronic kidney disease 04/2017   left renal mass   Chronic systolic heart failure ([place])    Encounter for antineoplastic chemotherapy 05/03/2017   Epiretinal membrane (ERM) of left eye 05/09/2013   Glaucoma    History of radiation therapy 2018   ended 06-2017   Ischemic cardiomyopathy    35-45% on [facility] 07/24/19   Lamellar  macular hole, left eye 05/09/2013   Left renal mass 04/05/2017   Mass of left thigh 03/25/2019   Mixed hyperlipidemia 08/02/2019   Multiple thyroid nodules 06/02/2017   Non-ST elevation (NSTEMI) myocardial infarction ([place]) 07/23/2019   Nontoxic single thyroid nodule 06/14/2017   Nuclear sclerotic cataract of left eye 01/24/2013   Obesity    Obesity, unspecified 05/21/2012   Primary open-angle glaucoma 04/16/2012   Pseudophakia 09/13/2012   Overview:  S/p YAG CAPS OD ~ 11/2012  Formatting of this note might be different from the original. Overview:  S/p YAG CAPS OD ~ 11/2012 Overview:  Overview:  S/p YAG CAPS OD ~ 11/2012   Pseudophakia of right eye 11/2012   Pulmonary nodules 04/2017   Rectal carcinoma ([place]) 04/18/2017   Retinal break of left eye 04/30/2012   Thyroid nodule 04/2017   multiple    Past Surgical History:  Procedure Laterality Date   COLON SURGERY  08/2017   COLONOSCOPY  04/2017   CORONARY STENT INTERVENTION N/A 07/24/2019   Procedure: CORONARY STENT INTERVENTION;  Surgeon: Jettie Booze, MD;  Location: [facility];  Service: Cardiovascular;  Laterality: N/A;   EYE SURGERY     POAG; lamellar macular hole on left; retinal break of left eye; nuclear sclerotic cataract left eye   FINGER SURGERY     IR RADIOLOGIST EVAL & MGMT  06/15/2017   IR RADIOLOGIST EVAL & MGMT  11/28/2017   IR RADIOLOGIST EVAL & MGMT  01/10/2018  IR RADIOLOGIST EVAL & MGMT  04/17/2018   IR RADIOLOGIST EVAL & MGMT  12/05/2019   IR RADIOLOGIST EVAL & MGMT  01/14/2021   KIDNEY SURGERY     LEFT HEART CATH AND CORONARY ANGIOGRAPHY N/A 07/24/2019   Procedure: LEFT HEART CATH AND CORONARY ANGIOGRAPHY;  Surgeon: Jettie Booze, MD;  Location: [facility];  Service: Cardiovascular;  Laterality: N/A;    Allergies: Amoxicillin, Influenza vaccines, and Ciprofloxacin  Medications: Prior to Admission medications   Medication Sig Start Date End Date Taking? Authorizing Provider   aspirin EC 81 MG tablet Take 1 tablet (81 mg total) by mouth daily. 07/23/19   Tobb, Kardie, DO  Cholecalciferol (VITAMIN D3) 2000 units capsule Take 2,000 Units by mouth at bedtime.     [provider]  latanoprost (XALATAN) 0.005 % ophthalmic solution Place 1 drop into both eyes at bedtime.  03/21/16   [provider]  nitroGLYCERIN (NITROSTAT) 0.4 MG SL tablet Place 1 tablet (0.4 mg total) under the tongue every 5 (five) minutes x 3 doses as needed for chest pain. 07/25/19   Duke, Tami Lin, PA  Omega-3 Fatty Acids (FISH OIL) 1200 MG CAPS Take by mouth daily.    [provider]  rosuvastatin (CRESTOR) 20 MG tablet TAKE 1 TABLET BY MOUTH EVERY DAY Patient taking differently: Take 10 mg by mouth daily. 04/12/21   Berniece Salines, DO     Family History  Problem Relation Age of Onset   Colon cancer Mother    Colon polyps Mother    Rectal cancer Mother    Esophageal cancer Maternal Great-grandfather    Stomach cancer Neg Hx     Social History   Socioeconomic History   Marital status: Married    Spouse name: Not on file   Number of children: 4   Years of education: Not on file   Highest education level: Not on file  Occupational History   Occupation: Retired   Tobacco Use   Smoking status: Never   Smokeless tobacco: Never  Vaping Use   Vaping Use: Never used  Substance and Sexual Activity   Alcohol use: Not Currently   Drug use: Never   Sexual activity: Not on file  Other Topics Concern   Not on file  Social History Narrative   Not on file   Social Determinants of Health   Financial Resource Strain: Not on file  Food Insecurity: Not on file  Transportation Needs: Not on file  Physical Activity: Not on file  Stress: Not on file  Social Connections: Not on file     Review of Systems  Review of Systems: A 12 point ROS discussed and pertinent positives are indicated in the HPI above.  All other systems are negative.  Physical Exam No  direct physical exam was performed, telephone visit only today Vital Signs: There were no vitals taken for this visit.  Imaging: No results found.  Labs:  CBC: No results for input(s): "WBC", "HGB", "HCT", "PLT" in the last 8760 hours.  COAGS: No results for input(s): "INR", "APTT" in the last 8760 hours.  BMP: No results for input(s): "NA", "K", "CL", "CO2", "GLUCOSE", "BUN", "CALCIUM", "CREATININE", "GFRNONAA", "GFRAA" in the last 8760 hours.  Invalid input(s): "CMP"  LIVER FUNCTION TESTS: No results for input(s): "BILITOT", "AST", "ALT", "ALKPHOS", "PROT", "ALBUMIN" in the last 8760 hours.   Assessment and Plan:  4 years status post left renal cryoablation of a solid enhancing left renal neoplasm/mass.  Surveillance imaging  performed 11/11/2021 in [facility] confirms a stable retracting ablation defect.  No signs of residual recurrent disease.  No new renal abnormality.  Overall he is doing very well.  Plan: Continue annual surveillance imaging with CT to complete a total of 5-year surveillance of the left renal cryoablation site   Electronically Signed: Greggory Keen 12/13/2021, 2:50 PM   I spent a total of    25 Minutes in remote  clinical consultation, greater than 50% of which was counseling/coordinating care for this patient status post renal cryoablation.    Visit type: Audio only (telephone). Audio (no video) only due to patient's lack of internet/smartphone capability. Alternative for in-person consultation at [facility], [address] [place], [place], [place]. This visit type was conducted due to national recommendations for restrictions regarding the COVID-19 Pandemic (e.g. social distancing).  This format is felt to be most appropriate for this patient at this time.  All issues noted in this document were discussed and addressed.

## 2021-12-24 ENCOUNTER — Other Ambulatory Visit: Payer: Self-pay | Admitting: Cardiology

## 2022-01-26 ENCOUNTER — Other Ambulatory Visit: Payer: Self-pay | Admitting: Cardiology

## 2022-01-31 ENCOUNTER — Ambulatory Visit: Payer: Medicare PPO | Admitting: Cardiology

## 2022-01-31 ENCOUNTER — Encounter: Payer: Self-pay | Admitting: Cardiology

## 2022-01-31 VITALS — BP 106/74 | HR 68 | Ht 68.0 in | Wt 171.8 lb

## 2022-01-31 DIAGNOSIS — E782 Mixed hyperlipidemia: Secondary | ICD-10-CM

## 2022-01-31 DIAGNOSIS — I2510 Atherosclerotic heart disease of native coronary artery without angina pectoris: Secondary | ICD-10-CM

## 2022-01-31 DIAGNOSIS — E559 Vitamin D deficiency, unspecified: Secondary | ICD-10-CM

## 2022-01-31 DIAGNOSIS — I214 Non-ST elevation (NSTEMI) myocardial infarction: Secondary | ICD-10-CM

## 2022-01-31 DIAGNOSIS — R7303 Prediabetes: Secondary | ICD-10-CM

## 2022-01-31 MED ORDER — NITROGLYCERIN 0.4 MG SL SUBL: 0.4000 mg | SUBLINGUAL_TABLET | SUBLINGUAL | 3 refills | Status: AC | PRN

## 2022-01-31 MED ORDER — ROSUVASTATIN CALCIUM 5 MG PO TABS: 5.0000 mg | ORAL_TABLET | Freq: Every day | ORAL | 3 refills | Status: DC

## 2022-01-31 NOTE — Patient Instructions (Signed)
Medication Instructions:  Your physician has recommended you make the following change in your medication:  DECREASE: Crestor 5 mg nightly  *If you need a refill on your cardiac medications before your next appointment, please call your pharmacy*   Lab Work: Your physician recommends that you return for lab work in:  TODAY: Lipids, CBC, Vitamin D, HgbA1c If you have labs (blood work) drawn today and your tests are completely normal, you will receive your results only by: [place] (if you have [place]) OR A paper copy in the mail If you have any lab test that is abnormal or we need to change your treatment, we will call you to review the results.   Testing/Procedures: None   Follow-Up: At [facility], you and your health needs are our priority.  As part of our continuing mission to provide you with exceptional heart care, we have created designated Provider Care Teams.  These Care Teams include your primary Cardiologist (physician) and Advanced Practice Providers (APPs -  Physician Assistants and Nurse Practitioners) who all work together to provide you with the care you need, when you need it.  We recommend signing up for the patient portal called "MyChart".  Sign up information is provided on this After Visit Summary.  MyChart is used to connect with patients for Virtual Visits (Telemedicine).  Patients are able to view lab/test results, encounter notes, upcoming appointments, etc.  Non-urgent messages can be sent to your provider as well.   To learn more about what you can do with MyChart, go to [email or web address].    Your next appointment:   1 year(s)  The format for your next appointment:   In Person  Provider:   Berniece Salines, DO     Other Instructions   Important Information About Sugar

## 2022-01-31 NOTE — Progress Notes (Signed)
Cardiology Office Note:    Date:  01/31/2022   ID:  Jonathon Oneill, DOB May 30, 1946, MRN [id number]  PCP:  Nicoletta Dress, MD  Cardiologist:  Berniece Salines, DO  Electrophysiologist:  None   Referring MD: Nicoletta Dress, MD   " I am doing well"  History of Present Illness:    Jonathon Oneill is a 76 y.o. male with a hx of CAD status post stent to his LAD and RCA placed in January 20,2021 on dual antiplatelet therapy, hyperlipidemia, ischemic cardiomyopathy his EF has improved slightly on his recent echo from 45-50% from 35-40% in 07/2019 and proximal ascending aorta location.    I saw the patient on 11/13/2019 at that time I cut back on his crestor to 20  mg daily. He was planing a CTA with his oncologist  Which will also give [place] information on the ascending aorta dilatation. He was involved in exercise program and was happy with it.   I saw the patient in November 2021 at that time he was stable from a cardiovascular standpoint.  Since I saw the patient he had gotten his CT scan done with [facility] which show stable exam.  I saw the patient on 11/17/2021 at that time he was doing well from vascular standpoint.  I kept the patient on his aspirin as well as his rosuvastatin given in terms of his coronary artery disease.  He had gotten a repeat CT scan for his thoracic aneurysm.  He is here today for follow-up visit. He reports that he has been experiencing intermittent to fatigue and has had some muscle aching.  No chest pain.  Past Medical History:  Diagnosis Date   Abdominal pain, acute, generalized 04/05/2017   Overview:  Added automatically from request for surgery [id number]  Formatting of this note might be different from the original. Added automatically from request for surgery [id number]   Abnormal CT of the abdomen 04/05/2017   Overview:  Added automatically from request for surgery [id number]  Formatting of this note might be different from the original. Added automatically  from request for surgery [id number]   Allergy    CAD (coronary artery disease), native coronary artery 07/24/2019   LHC 07/24/19: DES-pLAD, DES-dRCA   Cancer ([place]) 04/2017   rectal cancer. kidney cancer   Cataract    removed right eye, left eye forming   Chronic kidney disease 04/2017   left renal mass   Chronic systolic heart failure ([place])    Encounter for antineoplastic chemotherapy 05/03/2017   Epiretinal membrane (ERM) of left eye 05/09/2013   Glaucoma    History of radiation therapy 2018   ended 06-2017   Ischemic cardiomyopathy    35-45% on [facility] 07/24/19   Lamellar macular hole, left eye 05/09/2013   Left renal mass 04/05/2017   Mass of left thigh 03/25/2019   Mixed hyperlipidemia 08/02/2019   Multiple thyroid nodules 06/02/2017   Non-ST elevation (NSTEMI) myocardial infarction ([place]) 07/23/2019   Nontoxic single thyroid nodule 06/14/2017   Nuclear sclerotic cataract of left eye 01/24/2013   Obesity    Obesity, unspecified 05/21/2012   Primary open-angle glaucoma 04/16/2012   Pseudophakia 09/13/2012   Overview:  S/p YAG CAPS OD ~ 11/2012  Formatting of this note might be different from the original. Overview:  S/p YAG CAPS OD ~ 11/2012 Overview:  Overview:  S/p YAG CAPS OD ~ 11/2012   Pseudophakia of right eye 11/2012   Pulmonary nodules 04/2017   Rectal carcinoma (  [place]) 04/18/2017   Retinal break of left eye 04/30/2012   Thyroid nodule 04/2017   multiple    Past Surgical History:  Procedure Laterality Date   COLON SURGERY  08/2017   COLONOSCOPY  04/2017   CORONARY STENT INTERVENTION N/A 07/24/2019   Procedure: CORONARY STENT INTERVENTION;  Surgeon: Jettie Booze, MD;  Location: [facility];  Service: Cardiovascular;  Laterality: N/A;   EYE SURGERY     POAG; lamellar macular hole on left; retinal break of left eye; nuclear sclerotic cataract left eye   FINGER SURGERY     IR RADIOLOGIST EVAL & MGMT  06/15/2017   IR RADIOLOGIST EVAL & MGMT  11/28/2017   IR  RADIOLOGIST EVAL & MGMT  01/10/2018   IR RADIOLOGIST EVAL & MGMT  04/17/2018   IR RADIOLOGIST EVAL & MGMT  12/05/2019   IR RADIOLOGIST EVAL & MGMT  01/14/2021   IR RADIOLOGIST EVAL & MGMT  12/13/2021   KIDNEY SURGERY     LEFT HEART CATH AND CORONARY ANGIOGRAPHY N/A 07/24/2019   Procedure: LEFT HEART CATH AND CORONARY ANGIOGRAPHY;  Surgeon: Jettie Booze, MD;  Location: [facility];  Service: Cardiovascular;  Laterality: N/A;    Current Medications: Current Meds  Medication Sig   aspirin EC 81 MG tablet Take 1 tablet (81 mg total) by mouth daily.   Cholecalciferol (VITAMIN D3) 2000 units capsule Take 2,000 Units by mouth at bedtime.    latanoprost (XALATAN) 0.005 % ophthalmic solution Place 1 drop into both eyes at bedtime.    nitroGLYCERIN (NITROSTAT) 0.4 MG SL tablet Place 1 tablet (0.4 mg total) under the tongue every 5 (five) minutes x 3 doses as needed for chest pain.   Omega-3 Fatty Acids (FISH OIL) 1200 MG CAPS Take by mouth daily.   rosuvastatin (CRESTOR) 5 MG tablet Take 1 tablet (5 mg total) by mouth daily.   [DISCONTINUED] rosuvastatin (CRESTOR) 20 MG tablet Take 1 tablet (20 mg total) by mouth daily. SCHEDULE OFFICE VISIT FOR FUTURE REFILLS.     Allergies:   Amoxicillin, Influenza vaccines, and Ciprofloxacin   Social History   Socioeconomic History   Marital status: Married    Spouse name: Not on file   Number of children: 4   Years of education: Not on file   Highest education level: Not on file  Occupational History   Occupation: Retired   Tobacco Use   Smoking status: Never   Smokeless tobacco: Never  Vaping Use   Vaping Use: Never used  Substance and Sexual Activity   Alcohol use: Not Currently   Drug use: Never   Sexual activity: Not on file  Other Topics Concern   Not on file  Social History Narrative   Not on file   Social Determinants of Health   Financial Resource Strain: Not on file  Food Insecurity: Not on file  Transportation Needs:  Not on file  Physical Activity: Not on file  Stress: Not on file  Social Connections: Not on file     Family History: The patient's family history includes Colon cancer in his mother; Colon polyps in his mother; Esophageal cancer in his maternal great-grandfather; Rectal cancer in his mother. There is no history of Stomach cancer.  ROS:   Review of Systems  Constitution: Negative for decreased appetite, fever and weight gain.  HENT: Negative for congestion, ear discharge, hoarse voice and sore throat.   Eyes: Negative for discharge, redness, vision loss in right eye and visual  halos.  Cardiovascular: Negative for chest pain, dyspnea on exertion, leg swelling, orthopnea and palpitations.  Respiratory: Negative for cough, hemoptysis, shortness of breath and snoring.   Endocrine: Negative for heat intolerance and polyphagia.  Hematologic/Lymphatic: Negative for bleeding problem. Does not bruise/bleed easily.  Skin: Negative for flushing, nail changes, rash and suspicious lesions.  Musculoskeletal: Negative for arthritis, joint pain, muscle cramps, myalgias, neck pain and stiffness.  Gastrointestinal: Negative for abdominal pain, bowel incontinence, diarrhea and excessive appetite.  Genitourinary: Negative for decreased libido, genital sores and incomplete emptying.  Neurological: Negative for brief paralysis, focal weakness, headaches and loss of balance.  Psychiatric/Behavioral: Negative for altered mental status, depression and suicidal ideas.  Allergic/Immunologic: Negative for HIV exposure and persistent infections.    EKGs/Labs/Other Studies Reviewed:    The following studies were reviewed today:   EKG:  The ekg ordered today demonstrates sinus rhythm with sinus arrhythmia.  68 bpm.  CT Chest 11/11/2021 IMPRESSION:  1. Signs of prior rectal resection and colorectal/colo anal  anastomosis. No signs of disease recurrence.  2. Post treatment changes about the presacral region  without change  compared to previous imaging.  3. No evidence of metastatic disease.  4. Subtle area of added enhancement in the pancreatic head favored  to represent small vessels passing through pancreatic parenchyma.  This appears to a been present as far back as w May of 2020 and  displays no change, attention on follow-up.  5. Stable prostatomegaly and urinary bladder wall thickening.  6. 2.2 cm LEFT thyroid nodule with heterogeneity. Recommend thyroid  US (ref: J Am Coll Radiol. 2015 Feb;12(2): 143-50).   Aortic Atherosclerosis (ICD10-I70.0).     CT scan done Nov 10, 2020 CT CHEST FINDINGS   Cardiovascular: The heart size is normal. No substantial pericardial  effusion. Coronary artery calcification is evident. Atherosclerotic  calcification is noted in the wall of the thoracic aorta.   Mediastinum/Nodes: 2.1 cm left thyroid nodule stable since chest CT  04/16/2018. No follow-up imaging recommended (ref: J Am Coll Radiol.  2015 Feb;12(2): 143-50). No mediastinal lymphadenopathy. There is no  hilar lymphadenopathy. The esophagus has normal imaging features.  There is no axillary lymphadenopathy.   Lungs/Pleura: 4 mm right lower lobe nodule on 94/4 is stable since  2019. Tiny right upper lobe pulmonary nodule seen on images 55 and  66 are unchanged since 2019 consistent with benign etiology. No new  suspicious pulmonary nodule or mass. No focal airspace  consolidation. No pleural effusion.   Musculoskeletal: No worrisome lytic or sclerotic osseous  abnormality.   CT ABDOMEN PELVIS FINDINGS   Hepatobiliary: No suspicious focal abnormality within the liver  parenchyma. Tiny hypodensity in the central right liver is stable  consistent with benign etiology. There is no evidence for  gallstones, gallbladder wall thickening, or pericholecystic fluid.  No intrahepatic or extrahepatic biliary dilation.   Pancreas: No focal mass lesion. No dilatation of the main duct. No   intraparenchymal cyst. No peripancreatic edema.   Spleen: No splenomegaly. No focal mass lesion.   Adrenals/Urinary Tract: No adrenal nodule or mass. Right kidney  unremarkable. Stable appearance ablation defect lower interpolar  left kidney. No evidence for hydroureter. Mild circumferential  bladder wall thickening again noted, eccentric to the left.   Stomach/Bowel: Stomach is unremarkable. No gastric wall thickening.  No evidence of outlet obstruction. Duodenum is normally positioned  as is the ligament of Treitz. No small bowel wall thickening. No  small bowel dilatation. Rectal anastomosis is again noted  with  sequelae of low anterior section. Surgical bed is stable in  appearance in the interval. Colon is distended with stool.   Vascular/Lymphatic: There is abdominal aortic atherosclerosis  without aneurysm. There is no gastrohepatic or hepatoduodenal  ligament lymphadenopathy. No retroperitoneal or mesenteric  lymphadenopathy. No pelvic sidewall lymphadenopathy.   Reproductive: Prostate gland appears enlarged.   Other: No intraperitoneal free fluid.   Musculoskeletal: No worrisome lytic or sclerotic osseous  abnormality.   IMPRESSION:  1. Stable exam. No new or progressive findings to suggest recurrent  or metastatic disease in the chest, abdomen, or pelvis.  2. Stable appearance ablation defect lower interpolar left kidney.  3. Stable tiny right pulmonary nodules, consistent with benign  etiology.  4. Prostatomegaly with circumferential bladder wall thickening, more  prominent on the left.  5. Aortic Atherosclerosis (ICD10-I70.0).    Electronically Signed    By: Misty Stanley M.D.    On: 11/11/2020 11:11   Procedure Note   Virgilio Frees, MD - 11/11/2020  Formatting of this note might be different from the original.  CLINICAL DATA:  Rectal cancer status post LAR. Also with a history  of left renal cell carcinoma status post cryoablation. Restaging.    EXAM:  CT CHEST, ABDOMEN, AND PELVIS WITH CONTRAST   TECHNIQUE:  Multidetector CT imaging of the chest, abdomen and pelvis was  performed following the standard protocol during bolus  administration of intravenous contrast.   CONTRAST:  80 cc Omnipaque 350   COMPARISON:  Abdomen/pelvis CT 11/18/2019 chest CT 04/16/2018   FINDINGS:  CT CHEST FINDINGS   Cardiovascular: The heart size is normal. No substantial pericardial  effusion. Coronary artery calcification is evident. Atherosclerotic  calcification is noted in the wall of the thoracic aorta.   Mediastinum/Nodes: 2.1 cm left thyroid nodule stable since chest CT  04/16/2018. No follow-up imaging recommended (ref: J Am Coll Radiol.  2015 Feb;12(2): 143-50). No mediastinal lymphadenopathy. There is no  hilar lymphadenopathy. The esophagus has normal imaging features.  There is no axillary lymphadenopathy.   Lungs/Pleura: 4 mm right lower lobe nodule on 94/4 is stable since  2019. Tiny right upper lobe pulmonary nodule seen on images 55 and  66 are unchanged since 2019 consistent with benign etiology. No new  suspicious pulmonary nodule or mass. No focal airspace  consolidation. No pleural effusion.   Musculoskeletal: No worrisome lytic or sclerotic osseous  abnormality.   CT ABDOMEN PELVIS FINDINGS   Hepatobiliary: No suspicious focal abnormality within the liver  parenchyma. Tiny hypodensity in the central right liver is stable  consistent with benign etiology. There is no evidence for  gallstones, gallbladder wall thickening, or pericholecystic fluid.  No intrahepatic or extrahepatic biliary dilation.   Pancreas: No focal mass lesion. No dilatation of the main duct. No  intraparenchymal cyst. No peripancreatic edema.   Spleen: No splenomegaly. No focal mass lesion.   Adrenals/Urinary Tract: No adrenal nodule or mass. Right kidney  unremarkable. Stable appearance ablation defect lower interpolar  left kidney. No  evidence for hydroureter. Mild circumferential  bladder wall thickening again noted, eccentric to the left.   Stomach/Bowel: Stomach is unremarkable. No gastric wall thickening.  No evidence of outlet obstruction. Duodenum is normally positioned  as is the ligament of Treitz. No small bowel wall thickening. No  small bowel dilatation. Rectal anastomosis is again noted with  sequelae of low anterior section. Surgical bed is stable in  appearance in the interval. Colon is distended with stool.  Vascular/Lymphatic: There is abdominal aortic atherosclerosis  without aneurysm. There is no gastrohepatic or hepatoduodenal  ligament lymphadenopathy. No retroperitoneal or mesenteric  lymphadenopathy. No pelvic sidewall lymphadenopathy.   Reproductive: Prostate gland appears enlarged.   Other: No intraperitoneal free fluid.   Musculoskeletal: No worrisome lytic or sclerotic osseous  abnormality.   IMPRESSION:  1. Stable exam. No new or progressive findings to suggest recurrent  or metastatic disease in the chest, abdomen, or pelvis.  2. Stable appearance ablation defect lower interpolar left kidney.  3. Stable tiny right pulmonary nodules, consistent with benign  etiology.  4. Prostatomegaly with circumferential bladder wall thickening, more  prominent on the left.  5. Aortic Atherosclerosis (ICD10-I70.0).   Recent Labs: No results found for requested labs within last 365 days.  Recent Lipid Panel    Component Value Date/Time   CHOL CANCELED 10/14/2019 1020   TRIG CANCELED 10/14/2019 1020   HDL CANCELED 10/14/2019 1020   CHOLHDL 2.0 10/14/2019 0000   CHOLHDL 5.9 07/24/2019 0105   VLDL 39 07/24/2019 0105   LDLCALC 24 10/14/2019 0000    Physical Exam:    VS:  BP 106/74   Pulse 68   Ht '5\' 8"'$  (1.727 m)   Wt 171 lb 12.8 oz (77.9 kg)   SpO2 99%   BMI 26.12 kg/m     Wt Readings from Last 3 Encounters:  01/31/22 171 lb 12.8 oz (77.9 kg)  09/16/21 176 lb (79.8 kg)  09/02/21  176 lb (79.8 kg)     GEN: Well nourished, well developed in no acute distress HEENT: Normal NECK: No JVD; No carotid bruits LYMPHATICS: No lymphadenopathy CARDIAC: S1S2 noted,RRR, no murmurs, rubs, gallops RESPIRATORY:  Clear to auscultation without rales, wheezing or rhonchi  ABDOMEN: Soft, non-tender, non-distended, +bowel sounds, no guarding. EXTREMITIES: No edema, No cyanosis, no clubbing MUSCULOSKELETAL:  No deformity  SKIN: Warm and dry NEUROLOGIC:  Alert and oriented x 3, non-focal PSYCHIATRIC:  Normal affect, good insight  ASSESSMENT:    1. Non-ST elevation (NSTEMI) myocardial infarction ([place])   2. Prediabetes   3. Coronary artery disease involving native coronary artery of native heart, unspecified whether angina present   4. Vitamin D deficiency   5. Mixed hyperlipidemia    PLAN:     1.  He appears to be doing well from a cardiovascular standpoint.  We will keep him on the aspirin 81 mg daily, we will cut back the Crestor to 5 mg daily as he is concerned that some of his symptoms are statin related.  We will get lipid profile today.  2.  In terms of the fatigue we will get vitamin D level for his history of known vitamin D deficiency.  We will also get CBC to make sure anemia is not playing a role here.  3.  He had a CT of the chest was done with his primary no changes has been made to the sizing of his thoracic aneurysm.  The patient is in agreement with the above plan. The patient left the office in stable condition.  The patient will follow up in 1 year or sooner if needed.   Medication Adjustments/Labs and Tests Ordered: Current medicines are reviewed at length with the patient today.  Concerns regarding medicines are outlined above.  Orders Placed This Encounter  Procedures   Lipid panel   CBC with Differential/Platelet   VITAMIN D 25 Hydroxy (Vit-D Deficiency, Fractures)   Hemoglobin A1c   EKG 12-Lead   Meds ordered this encounter  Medications    rosuvastatin (CRESTOR) 5 MG tablet    Sig: Take 1 tablet (5 mg total) by mouth daily.    Dispense:  90 tablet    Refill:  3    Patient Instructions  Medication Instructions:  Your physician has recommended you make the following change in your medication:  DECREASE: Crestor 5 mg nightly  *If you need a refill on your cardiac medications before your next appointment, please call your pharmacy*   Lab Work: Your physician recommends that you return for lab work in:  TODAY: Lipids, CBC, Vitamin D, HgbA1c If you have labs (blood work) drawn today and your tests are completely normal, you will receive your results only by: [place] (if you have Hodgkins) OR A paper copy in the mail If you have any lab test that is abnormal or we need to change your treatment, we will call you to review the results.   Testing/Procedures: None   Follow-Up: At [facility], you and your health needs are our priority.  As part of our continuing mission to provide you with exceptional heart care, we have created designated Provider Care Teams.  These Care Teams include your primary Cardiologist (physician) and Advanced Practice Providers (APPs -  Physician Assistants and Nurse Practitioners) who all work together to provide you with the care you need, when you need it.  We recommend signing up for the patient portal called "MyChart".  Sign up information is provided on this After Visit Summary.  MyChart is used to connect with patients for Virtual Visits (Telemedicine).  Patients are able to view lab/test results, encounter notes, upcoming appointments, etc.  Non-urgent messages can be sent to your provider as well.   To learn more about what you can do with MyChart, go to [email or web address].    Your next appointment:   1 year(s)  The format for your next appointment:   In Person  Provider:   Berniece Salines, DO     Other Instructions   Important Information About Sugar          Adopting a Healthy Lifestyle.  Know what a healthy weight is for you (roughly BMI <25) and aim to maintain this   Aim for 7+ servings of fruits and vegetables daily   65-80+ fluid ounces of water or unsweet tea for healthy kidneys   Limit to max 1 drink of alcohol per day; avoid smoking/tobacco   Limit animal fats in diet for cholesterol and heart health - choose grass fed whenever available   Avoid highly processed foods, and foods high in saturated/trans fats   Aim for low stress - take time to unwind and care for your mental health   Aim for 150 min of moderate intensity exercise weekly for heart health, and weights twice weekly for bone health   Aim for 7-9 hours of sleep daily   When it comes to diets, agreement about the perfect plan isnt easy to find, even among the experts. Experts at the [employer] developed an idea known as the Healthy Eating Plate. Just imagine a plate divided into logical, healthy portions.   The emphasis is on diet quality:   Load up on vegetables and fruits - one-half of your plate: Aim for color and variety, and remember that potatoes dont count.   Go for whole grains - one-quarter of your plate: Whole wheat, barley, wheat berries, quinoa, oats, brown rice, and foods made with them. If you want pasta, go  with whole wheat pasta.   Protein power - one-quarter of your plate: Fish, chicken, beans, and nuts are all healthy, versatile protein sources. Limit red meat.   The diet, however, does go beyond the plate, offering a few other suggestions.   Use healthy plant oils, such as olive, canola, soy, corn, sunflower and peanut. Check the labels, and avoid partially hydrogenated oil, which have unhealthy trans fats.   If youre thirsty, drink water. Coffee and tea are good in moderation, but skip sugary drinks and limit milk and dairy products to one or two daily servings.   The type of carbohydrate in the diet is more important  than the amount. Some sources of carbohydrates, such as vegetables, fruits, whole grains, and beans-are healthier than others.   Finally, stay active  Signed, Berniece Salines, DO  01/31/2022 8:23 AM    [facility]

## 2022-02-01 LAB — CBC WITH DIFFERENTIAL/PLATELET
Basophils Absolute: 0 10*3/uL (ref 0.0–0.2)
Basos: 0 %
EOS (ABSOLUTE): 0.1 10*3/uL (ref 0.0–0.4)
Eos: 3 %
Hematocrit: 43.8 % (ref 37.5–51.0)
Hemoglobin: 14.8 g/dL (ref 13.0–17.7)
Immature Grans (Abs): 0 10*3/uL (ref 0.0–0.1)
Immature Granulocytes: 0 %
Lymphocytes Absolute: 1 10*3/uL (ref 0.7–3.1)
Lymphs: 26 %
MCH: 30.5 pg (ref 26.6–33.0)
MCHC: 33.8 g/dL (ref 31.5–35.7)
MCV: 90 fL (ref 79–97)
Monocytes Absolute: 0.4 10*3/uL (ref 0.1–0.9)
Monocytes: 11 %
Neutrophils Absolute: 2.4 10*3/uL (ref 1.4–7.0)
Neutrophils: 60 %
Platelets: 194 10*3/uL (ref 150–450)
RBC: 4.85 x10E6/uL (ref 4.14–5.80)
RDW: 12.4 % (ref 11.6–15.4)
WBC: 3.9 10*3/uL (ref 3.4–10.8)

## 2022-02-01 LAB — LIPID PANEL
Chol/HDL Ratio: 2.3 ratio (ref 0.0–5.0)
Cholesterol, Total: 119 mg/dL (ref 100–199)
HDL: 52 mg/dL (ref >39)
LDL Chol Calc (NIH): 48 mg/dL (ref 0–99)
Triglycerides: 104 mg/dL (ref 0–149)
VLDL Cholesterol Cal: 19 mg/dL (ref 5–40)

## 2022-02-01 LAB — VITAMIN D 25 HYDROXY (VIT D DEFICIENCY, FRACTURES): Vit D, 25-Hydroxy: 70.5 ng/mL (ref 30.0–100.0)

## 2022-02-01 LAB — HEMOGLOBIN A1C
Est. average glucose Bld gHb Est-mCnc: 114 mg/dL
Hgb A1c MFr Bld: 5.6 % (ref 4.8–5.6)

## 2022-02-01 NOTE — Addendum Note (Signed)
Addended by: Linton Ham on: 02/01/2022 07:24 AM   Modules accepted: Orders

## 2022-02-17 ENCOUNTER — Telehealth: Payer: Self-pay | Admitting: Cardiology

## 2022-02-17 NOTE — Telephone Encounter (Signed)
Lab test normal.  Written by Berniece Salines, DO on 02/02/2022 10:16 AM EDT     Patient aware and verbalized understanding.

## 2022-02-17 NOTE — Telephone Encounter (Signed)
Pt states he would like someone to go over his lab results with him.

## 2022-02-18 ENCOUNTER — Other Ambulatory Visit: Payer: Self-pay | Admitting: Cardiology

## 2022-02-25 ENCOUNTER — Other Ambulatory Visit: Payer: Self-pay | Admitting: Cardiology

## 2022-03-11 ENCOUNTER — Other Ambulatory Visit: Payer: Self-pay | Admitting: Cardiology

## 2022-06-02 DIAGNOSIS — D708 Other neutropenia: Secondary | ICD-10-CM | POA: Insufficient documentation

## 2022-07-22 ENCOUNTER — Other Ambulatory Visit: Payer: Self-pay

## 2022-07-22 MED ORDER — ROSUVASTATIN CALCIUM 5 MG PO TABS: 5.0000 mg | ORAL_TABLET | Freq: Every day | ORAL | 3 refills | Status: DC

## 2022-07-22 NOTE — Telephone Encounter (Signed)
Pt sent a letter requesting medication refill. Refill sent to preferred pharmacy. Left message letting pt know refill was sent in.

## 2022-12-02 ENCOUNTER — Other Ambulatory Visit: Payer: Self-pay | Admitting: Interventional Radiology

## 2022-12-02 DIAGNOSIS — N2889 Other specified disorders of kidney and ureter: Secondary | ICD-10-CM

## 2022-12-21 ENCOUNTER — Ambulatory Visit
Admission: RE | Admit: 2022-12-21 | Discharge: 2022-12-21 | Disposition: A | Discharge status: Home / Self Care | Payer: Medicare PPO | Source: Ambulatory Visit | Attending: Interventional Radiology | Admitting: Interventional Radiology

## 2022-12-21 DIAGNOSIS — N2889 Other specified disorders of kidney and ureter: Secondary | ICD-10-CM

## 2022-12-21 NOTE — Progress Notes (Signed)
Patient ID: Jonathon Oneill, male   DOB: Apr 25, 1946, 77 y.o.   MRN: [id number]       Chief Complaint:  Left renal cryoablation, 5-year surveillance follow-up  Referring Physician(s): Eskew  History of Present Illness: Jonathon Oneill is a 77 y.o. male who has a remote history of colon cancer status post radiation, resection, chemotherapy.  He is now 5 years status post cryoablation of a solid enhancing 13 mm left renal mass/neoplasm.  He continues to do very well as an outpatient.  Stable excellent functional status.  No urinary tract symptoms including flank pain, abdominal pain, hematuria or dysuria.  No recent illness or fevers.  He remains very active.  Stable weight and appetite.  Surveillance imaging from [facility] on 11/25/2022 again demonstrates a stable small ablation defect in the left kidney anteriorly.  No signs of residual or recurrent disease.  No other suspicious renal abnormality.  No other acute process or suspicious finding by CT of the chest abdomen and pelvis.  Past Medical History:  Diagnosis Date   Abdominal pain, acute, generalized 04/05/2017   Overview:  Added automatically from request for surgery [id number]  Formatting of this note might be different from the original. Added automatically from request for surgery [id number]   Abnormal CT of the abdomen 04/05/2017   Overview:  Added automatically from request for surgery [id number]  Formatting of this note might be different from the original. Added automatically from request for surgery [id number]   Allergy    CAD (coronary artery disease), native coronary artery 07/24/2019   LHC 07/24/19: DES-pLAD, DES-dRCA   Cancer (HCC) 04/2017   rectal cancer. kidney cancer   Cataract    removed right eye, left eye forming   Chronic kidney disease 04/2017   left renal mass   Chronic systolic heart failure (HCC)    Encounter for antineoplastic chemotherapy 05/03/2017   Epiretinal membrane (ERM) of left eye 05/09/2013   Glaucoma     History of radiation therapy 2018   ended 06-2017   Ischemic cardiomyopathy    35-45% on [facility] 07/24/19   Lamellar macular hole, left eye 05/09/2013   Left renal mass 04/05/2017   Mass of left thigh 03/25/2019   Mixed hyperlipidemia 08/02/2019   Multiple thyroid nodules 06/02/2017   Non-ST elevation (NSTEMI) myocardial infarction (HCC) 07/23/2019   Nontoxic single thyroid nodule 06/14/2017   Nuclear sclerotic cataract of left eye 01/24/2013   Obesity    Obesity, unspecified 05/21/2012   Primary open-angle glaucoma 04/16/2012   Pseudophakia 09/13/2012   Overview:  S/p YAG CAPS OD ~ 11/2012  Formatting of this note might be different from the original. Overview:  S/p YAG CAPS OD ~ 11/2012 Overview:  Overview:  S/p YAG CAPS OD ~ 11/2012   Pseudophakia of right eye 11/2012   Pulmonary nodules 04/2017   Rectal carcinoma (HCC) 04/18/2017   Retinal break of left eye 04/30/2012   Thyroid nodule 04/2017   multiple    Past Surgical History:  Procedure Laterality Date   COLON SURGERY  08/2017   COLONOSCOPY  04/2017   CORONARY STENT INTERVENTION N/A 07/24/2019   Procedure: CORONARY STENT INTERVENTION;  Surgeon: Corky Crafts, MD;  Location: [facility] INVASIVE CV LAB;  Service: Cardiovascular;  Laterality: N/A;   EYE SURGERY     POAG; lamellar macular hole on left; retinal break of left eye; nuclear sclerotic cataract left eye   FINGER SURGERY     IR RADIOLOGIST EVAL & MGMT  06/15/2017  IR RADIOLOGIST EVAL & MGMT  11/28/2017   IR RADIOLOGIST EVAL & MGMT  01/10/2018   IR RADIOLOGIST EVAL & MGMT  04/17/2018   IR RADIOLOGIST EVAL & MGMT  12/05/2019   IR RADIOLOGIST EVAL & MGMT  01/14/2021   IR RADIOLOGIST EVAL & MGMT  12/13/2021   KIDNEY SURGERY     LEFT HEART CATH AND CORONARY ANGIOGRAPHY N/A 07/24/2019   Procedure: LEFT HEART CATH AND CORONARY ANGIOGRAPHY;  Surgeon: Corky Crafts, MD;  Location: [facility] INVASIVE CV LAB;  Service: Cardiovascular;  Laterality: N/A;    Allergies: Amoxicillin,  Influenza vaccines, and Ciprofloxacin  Medications: Prior to Admission medications   Medication Sig Start Date End Date Taking? Authorizing Provider  aspirin EC 81 MG tablet Take 1 tablet (81 mg total) by mouth daily. 07/23/19   Tobb, Kardie, DO  Cholecalciferol (VITAMIN D3) 2000 units capsule Take 2,000 Units by mouth at bedtime.     [provider]  latanoprost (XALATAN) 0.005 % ophthalmic solution Place 1 drop into both eyes at bedtime.  03/21/16   [provider]  nitroGLYCERIN (NITROSTAT) 0.4 MG SL tablet Place 1 tablet (0.4 mg total) under the tongue every 5 (five) minutes x 3 doses as needed for chest pain. 01/31/22   Tobb, Kardie, DO  Omega-3 Fatty Acids (FISH OIL) 1200 MG CAPS Take by mouth daily.    [provider]  rosuvastatin (CRESTOR) 5 MG tablet Take 1 tablet (5 mg total) by mouth daily. 07/22/22   Thomasene Ripple, DO     Family History  Problem Relation Age of Onset   Colon cancer Mother    Colon polyps Mother    Rectal cancer Mother    Esophageal cancer Maternal Great-grandfather    Stomach cancer Neg Hx     Social History   Socioeconomic History   Marital status: Married    Spouse name: Not on file   Number of children: 4   Years of education: Not on file   Highest education level: Not on file  Occupational History   Occupation: Retired   Tobacco Use   Smoking status: Never   Smokeless tobacco: Never  Vaping Use   Vaping Use: Never used  Substance and Sexual Activity   Alcohol use: Not Currently   Drug use: Never   Sexual activity: Not on file  Other Topics Concern   Not on file  Social History Narrative   Not on file   Social Determinants of Health   Financial Resource Strain: Not on file  Food Insecurity: Not on file  Transportation Needs: Not on file  Physical Activity: Not on file  Stress: Not on file  Social Connections: Not on file    ECOG Status: 0 - Asymptomatic  Review of Systems  Review of Systems: A 12 point  ROS discussed and pertinent positives are indicated in the HPI above.  All other systems are negative.      Physical Exam No direct physical exam was performed  Telehealth visit only today to review interval surveillance imaging  Vital Signs: There were no vitals taken for this visit.  Imaging: No results found.  Labs:  CBC: Recent Labs    01/31/22 0825  WBC 3.9  HGB 14.8  HCT 43.8  PLT 194    COAGS: No results for input(s): "INR", "APTT" in the last 8760 hours.  BMP: No results for input(s): "NA", "K", "CL", "CO2", "GLUCOSE", "BUN", "CALCIUM", "CREATININE", "GFRNONAA", "GFRAA" in the last 8760 hours.  Invalid input(s): "  CMP"  LIVER FUNCTION TESTS: No results for input(s): "BILITOT", "AST", "ALT", "ALKPHOS", "PROT", "ALBUMIN" in the last 8760 hours.    Assessment and Plan:  5 years status post left renal cryoablation of a solid enhancing left renal neoplasm/mass.  Surveillance imaging 11/25/2022 at [facility] confirms a stable small ablation defect.  No signs of residual or recurrent disease.  No new renal abnormality.  Overall he continues to do very well.  Plan: This completes 5 years posttreatment surveillance.  No further imaging necessary from a renal standpoint.  Follow-up as needed.      Electronically Signed: Berdine Dance 12/21/2022, 10:18 AM   I spent a total of    25 Minutes in remote  clinical consultation, greater than 50% of which was counseling/coordinating care for This patient status post left renal cryoablation.    Visit type: Audio only (telephone). Audio (no video) only due to patient's lack of internet/smartphone capability. Alternative for in-person consultation at [facility], [address] [place], [place], [place].  This format is felt to be most appropriate for this patient at this time.  All issues noted in this document were discussed and addressed.

## 2023-02-09 ENCOUNTER — Ambulatory Visit: Payer: Medicare PPO | Admitting: Cardiology

## 2023-02-09 ENCOUNTER — Encounter: Payer: Self-pay | Admitting: Cardiology

## 2023-02-09 VITALS — BP 104/60 | HR 59 | Ht 68.0 in | Wt 176.8 lb

## 2023-02-09 DIAGNOSIS — E782 Mixed hyperlipidemia: Secondary | ICD-10-CM | POA: Diagnosis not present

## 2023-02-09 DIAGNOSIS — I2510 Atherosclerotic heart disease of native coronary artery without angina pectoris: Secondary | ICD-10-CM | POA: Diagnosis not present

## 2023-02-09 MED ORDER — ROSUVASTATIN CALCIUM 5 MG PO TABS: 5.0000 mg | ORAL_TABLET | ORAL | 3 refills | Status: DC

## 2023-02-09 NOTE — Patient Instructions (Signed)
Medication Instructions:  Your physician has recommended you make the following change in your medication:  CHANGE: Crestor 5 mg every other night *If you need a refill on your cardiac medications before your next appointment, please call your pharmacy*   Lab Work: Your physician recommends that you return for lab work in 12 weeks at the [facility] office: Lipids - please go fasting If you have labs (blood work) drawn today and your tests are completely normal, you will receive your results only by: MyChart Message (if you have MyChart) OR A paper copy in the mail If you have any lab test that is abnormal or we need to change your treatment, we will call you to review the results.   Testing/Procedures: None   Follow-Up: At [facility], you and your health needs are our priority.  As part of our continuing mission to provide you with exceptional heart care, we have created designated Provider Care Teams.  These Care Teams include your primary Cardiologist (physician) and Advanced Practice Providers (APPs -  Physician Assistants and Nurse Practitioners) who all work together to provide you with the care you need, when you need it.   Your next appointment:   1 year(s)  Provider:   Thomasene Ripple, DO

## 2023-02-09 NOTE — Progress Notes (Signed)
Cardiology Office Note:    Date:  02/09/2023   ID:  Jonathon Oneill, DOB 1946-04-08, MRN [id number]  PCP:  Jonathon Fusi, MD  Cardiologist:  Jonathon Ripple, DO  Electrophysiologist:  None   Referring MD: Jonathon Fusi, MD   " I am doing well"  History of Present Illness:    Jonathon Oneill is a 77 y.o. male with a hx of CAD status post stent to his LAD and RCA placed in January 20,2021 on dual antiplatelet therapy, hyperlipidemia, ischemic cardiomyopathy his EF has improved slightly on his recent echo from 45-50% from 35-40% in 07/2019 and proximal ascending aorta location.    He is here today for follow-up visit.  He had no complaints.  He tells me that he was excited that he was able to go to [place] in November.  He shared with me that his parents grew up in [place].  He did have cellulitis and has been treated with antibiotic and have improved significantly.  No other complaints at this time.  He feels great.     Past Medical History:  Diagnosis Date   Abdominal pain, acute, generalized 04/05/2017   Overview:  Added automatically from request for surgery [id number]  Formatting of this note might be different from the original. Added automatically from request for surgery [id number]   Abnormal CT of the abdomen 04/05/2017   Overview:  Added automatically from request for surgery [id number]  Formatting of this note might be different from the original. Added automatically from request for surgery [id number]   Allergy    CAD (coronary artery disease), native coronary artery 07/24/2019   LHC 07/24/19: DES-pLAD, DES-dRCA   Cancer (HCC) 04/2017   rectal cancer. kidney cancer   Cataract    removed right eye, left eye forming   Chronic kidney disease 04/2017   left renal mass   Chronic systolic heart failure (HCC)    Encounter for antineoplastic chemotherapy 05/03/2017   Epiretinal membrane (ERM) of left eye 05/09/2013   Glaucoma    History of radiation therapy 2018   ended 06-2017    Ischemic cardiomyopathy    35-45% on [facility] 07/24/19   Lamellar macular hole, left eye 05/09/2013   Left renal mass 04/05/2017   Mass of left thigh 03/25/2019   Mixed hyperlipidemia 08/02/2019   Multiple thyroid nodules 06/02/2017   Non-ST elevation (NSTEMI) myocardial infarction (HCC) 07/23/2019   Nontoxic single thyroid nodule 06/14/2017   Nuclear sclerotic cataract of left eye 01/24/2013   Obesity    Obesity, unspecified 05/21/2012   Primary open-angle glaucoma 04/16/2012   Pseudophakia 09/13/2012   Overview:  S/p YAG CAPS OD ~ 11/2012  Formatting of this note might be different from the original. Overview:  S/p YAG CAPS OD ~ 11/2012 Overview:  Overview:  S/p YAG CAPS OD ~ 11/2012   Pseudophakia of right eye 11/2012   Pulmonary nodules 04/2017   Rectal carcinoma (HCC) 04/18/2017   Retinal break of left eye 04/30/2012   Thyroid nodule 04/2017   multiple    Past Surgical History:  Procedure Laterality Date   COLON SURGERY  08/2017   COLONOSCOPY  04/2017   CORONARY STENT INTERVENTION N/A 07/24/2019   Procedure: CORONARY STENT INTERVENTION;  Surgeon: Corky Crafts, MD;  Location: [facility] INVASIVE CV LAB;  Service: Cardiovascular;  Laterality: N/A;   EYE SURGERY     POAG; lamellar macular hole on left; retinal break of left eye; nuclear sclerotic cataract left eye   FINGER SURGERY  IR RADIOLOGIST EVAL & MGMT  06/15/2017   IR RADIOLOGIST EVAL & MGMT  11/28/2017   IR RADIOLOGIST EVAL & MGMT  01/10/2018   IR RADIOLOGIST EVAL & MGMT  04/17/2018   IR RADIOLOGIST EVAL & MGMT  12/05/2019   IR RADIOLOGIST EVAL & MGMT  01/14/2021   IR RADIOLOGIST EVAL & MGMT  12/13/2021   KIDNEY SURGERY     LEFT HEART CATH AND CORONARY ANGIOGRAPHY N/A 07/24/2019   Procedure: LEFT HEART CATH AND CORONARY ANGIOGRAPHY;  Surgeon: Corky Crafts, MD;  Location: [facility] INVASIVE CV LAB;  Service: Cardiovascular;  Laterality: N/A;    Current Medications: Current Meds  Medication Sig   aspirin EC 81 MG tablet Take  1 tablet (81 mg total) by mouth daily.   Cholecalciferol (VITAMIN D3) 2000 units capsule Take 2,000 Units by mouth at bedtime.    Cyanocobalamin (VITAMIN B12 PO) Take by mouth daily.   latanoprost (XALATAN) 0.005 % ophthalmic solution Place 1 drop into both eyes at bedtime.    nitroGLYCERIN (NITROSTAT) 0.4 MG SL tablet Place 1 tablet (0.4 mg total) under the tongue every 5 (five) minutes x 3 doses as needed for chest pain.   Omega-3 Fatty Acids (FISH OIL) 1200 MG CAPS Take by mouth daily.   rosuvastatin (CRESTOR) 5 MG tablet Take 1 tablet (5 mg total) by mouth daily.     Allergies:   Amoxicillin, Influenza vaccines, and Ciprofloxacin   Social History   Socioeconomic History   Marital status: Married    Spouse name: Not on file   Number of children: 4   Years of education: Not on file   Highest education level: Not on file  Occupational History   Occupation: Retired   Tobacco Use   Smoking status: Never   Smokeless tobacco: Never  Vaping Use   Vaping status: Never Used  Substance and Sexual Activity   Alcohol use: Not Currently   Drug use: Never   Sexual activity: Not on file  Other Topics Concern   Not on file  Social History Narrative   Not on file   Social Determinants of Health   Financial Resource Strain: Not on file  Food Insecurity: Not on file  Transportation Needs: Not on file  Physical Activity: Not on file  Stress: Not on file  Social Connections: Not on file     Family History: The patient's family history includes Colon cancer in his mother; Colon polyps in his mother; Esophageal cancer in his maternal great-grandfather; Rectal cancer in his mother. There is no history of Stomach cancer.  ROS:   Review of Systems  Constitution: Negative for decreased appetite, fever and weight gain.  HENT: Negative for congestion, ear discharge, hoarse voice and sore throat.   Eyes: Negative for discharge, redness, vision loss in right eye and visual halos.   Cardiovascular: Negative for chest pain, dyspnea on exertion, leg swelling, orthopnea and palpitations.  Respiratory: Negative for cough, hemoptysis, shortness of breath and snoring.   Endocrine: Negative for heat intolerance and polyphagia.  Hematologic/Lymphatic: Negative for bleeding problem. Does not bruise/bleed easily.  Skin: Negative for flushing, nail changes, rash and suspicious lesions.  Musculoskeletal: Negative for arthritis, joint pain, muscle cramps, myalgias, neck pain and stiffness.  Gastrointestinal: Negative for abdominal pain, bowel incontinence, diarrhea and excessive appetite.  Genitourinary: Negative for decreased libido, genital sores and incomplete emptying.  Neurological: Negative for brief paralysis, focal weakness, headaches and loss of balance.  Psychiatric/Behavioral: Negative for altered mental status, depression and  suicidal ideas.  Allergic/Immunologic: Negative for HIV exposure and persistent infections.    EKGs/Labs/Other Studies Reviewed:    The following studies were reviewed today:   EKG:  The ekg ordered today demonstrates sinus bradycardia, heart rate 59 bpm.  CT Chest 11/11/2021 IMPRESSION:  1. Signs of prior rectal resection and colorectal/colo anal  anastomosis. No signs of disease recurrence.  2. Post treatment changes about the presacral region without change  compared to previous imaging.  3. No evidence of metastatic disease.  4. Subtle area of added enhancement in the pancreatic head favored  to represent small vessels passing through pancreatic parenchyma.  This appears to a been present as far back as w May of 2020 and  displays no change, attention on follow-up.  5. Stable prostatomegaly and urinary bladder wall thickening.  6. 2.2 cm LEFT thyroid nodule with heterogeneity. Recommend thyroid  US (ref: J Am Coll Radiol. 2015 Feb;12(2): 143-50).   Aortic Atherosclerosis (ICD10-I70.0).     CT scan done Nov 10, 2020 CT CHEST  FINDINGS   Cardiovascular: The heart size is normal. No substantial pericardial  effusion. Coronary artery calcification is evident. Atherosclerotic  calcification is noted in the wall of the thoracic aorta.   Mediastinum/Nodes: 2.1 cm left thyroid nodule stable since chest CT  04/16/2018. No follow-up imaging recommended (ref: J Am Coll Radiol.  2015 Feb;12(2): 143-50). No mediastinal lymphadenopathy. There is no  hilar lymphadenopathy. The esophagus has normal imaging features.  There is no axillary lymphadenopathy.   Lungs/Pleura: 4 mm right lower lobe nodule on 94/4 is stable since  2019. Tiny right upper lobe pulmonary nodule seen on images 55 and  66 are unchanged since 2019 consistent with benign etiology. No new  suspicious pulmonary nodule or mass. No focal airspace  consolidation. No pleural effusion.   Musculoskeletal: No worrisome lytic or sclerotic osseous  abnormality.   CT ABDOMEN PELVIS FINDINGS   Hepatobiliary: No suspicious focal abnormality within the liver  parenchyma. Tiny hypodensity in the central right liver is stable  consistent with benign etiology. There is no evidence for  gallstones, gallbladder wall thickening, or pericholecystic fluid.  No intrahepatic or extrahepatic biliary dilation.   Pancreas: No focal mass lesion. No dilatation of the main duct. No  intraparenchymal cyst. No peripancreatic edema.   Spleen: No splenomegaly. No focal mass lesion.   Adrenals/Urinary Tract: No adrenal nodule or mass. Right kidney  unremarkable. Stable appearance ablation defect lower interpolar  left kidney. No evidence for hydroureter. Mild circumferential  bladder wall thickening again noted, eccentric to the left.   Stomach/Bowel: Stomach is unremarkable. No gastric wall thickening.  No evidence of outlet obstruction. Duodenum is normally positioned  as is the ligament of Treitz. No small bowel wall thickening. No  small bowel dilatation. Rectal anastomosis  is again noted with  sequelae of low anterior section. Surgical bed is stable in  appearance in the interval. Colon is distended with stool.   Vascular/Lymphatic: There is abdominal aortic atherosclerosis  without aneurysm. There is no gastrohepatic or hepatoduodenal  ligament lymphadenopathy. No retroperitoneal or mesenteric  lymphadenopathy. No pelvic sidewall lymphadenopathy.   Reproductive: Prostate gland appears enlarged.   Other: No intraperitoneal free fluid.   Musculoskeletal: No worrisome lytic or sclerotic osseous  abnormality.   IMPRESSION:  1. Stable exam. No new or progressive findings to suggest recurrent  or metastatic disease in the chest, abdomen, or pelvis.  2. Stable appearance ablation defect lower interpolar left kidney.  3. Stable tiny right  pulmonary nodules, consistent with benign  etiology.  4. Prostatomegaly with circumferential bladder wall thickening, more  prominent on the left.  5. Aortic Atherosclerosis (ICD10-I70.0).    Electronically Signed    By: Kennith Center M.D.    On: 11/11/2020 11:11   Procedure Note   Domenick Bookbinder, MD - 11/11/2020  Formatting of this note might be different from the original.  CLINICAL DATA:  Rectal cancer status post LAR. Also with a history  of left renal cell carcinoma status post cryoablation. Restaging.   EXAM:  CT CHEST, ABDOMEN, AND PELVIS WITH CONTRAST   TECHNIQUE:  Multidetector CT imaging of the chest, abdomen and pelvis was  performed following the standard protocol during bolus  administration of intravenous contrast.   CONTRAST:  80 cc Omnipaque 350   COMPARISON:  Abdomen/pelvis CT 11/18/2019 chest CT 04/16/2018   FINDINGS:  CT CHEST FINDINGS   Cardiovascular: The heart size is normal. No substantial pericardial  effusion. Coronary artery calcification is evident. Atherosclerotic  calcification is noted in the wall of the thoracic aorta.   Mediastinum/Nodes: 2.1 cm left thyroid  nodule stable since chest CT  04/16/2018. No follow-up imaging recommended (ref: J Am Coll Radiol.  2015 Feb;12(2): 143-50). No mediastinal lymphadenopathy. There is no  hilar lymphadenopathy. The esophagus has normal imaging features.  There is no axillary lymphadenopathy.   Lungs/Pleura: 4 mm right lower lobe nodule on 94/4 is stable since  2019. Tiny right upper lobe pulmonary nodule seen on images 55 and  66 are unchanged since 2019 consistent with benign etiology. No new  suspicious pulmonary nodule or mass. No focal airspace  consolidation. No pleural effusion.   Musculoskeletal: No worrisome lytic or sclerotic osseous  abnormality.   CT ABDOMEN PELVIS FINDINGS   Hepatobiliary: No suspicious focal abnormality within the liver  parenchyma. Tiny hypodensity in the central right liver is stable  consistent with benign etiology. There is no evidence for  gallstones, gallbladder wall thickening, or pericholecystic fluid.  No intrahepatic or extrahepatic biliary dilation.   Pancreas: No focal mass lesion. No dilatation of the main duct. No  intraparenchymal cyst. No peripancreatic edema.   Spleen: No splenomegaly. No focal mass lesion.   Adrenals/Urinary Tract: No adrenal nodule or mass. Right kidney  unremarkable. Stable appearance ablation defect lower interpolar  left kidney. No evidence for hydroureter. Mild circumferential  bladder wall thickening again noted, eccentric to the left.   Stomach/Bowel: Stomach is unremarkable. No gastric wall thickening.  No evidence of outlet obstruction. Duodenum is normally positioned  as is the ligament of Treitz. No small bowel wall thickening. No  small bowel dilatation. Rectal anastomosis is again noted with  sequelae of low anterior section. Surgical bed is stable in  appearance in the interval. Colon is distended with stool.   Vascular/Lymphatic: There is abdominal aortic atherosclerosis  without aneurysm. There is no gastrohepatic  or hepatoduodenal  ligament lymphadenopathy. No retroperitoneal or mesenteric  lymphadenopathy. No pelvic sidewall lymphadenopathy.   Reproductive: Prostate gland appears enlarged.   Other: No intraperitoneal free fluid.   Musculoskeletal: No worrisome lytic or sclerotic osseous  abnormality.   IMPRESSION:  1. Stable exam. No new or progressive findings to suggest recurrent  or metastatic disease in the chest, abdomen, or pelvis.  2. Stable appearance ablation defect lower interpolar left kidney.  3. Stable tiny right pulmonary nodules, consistent with benign  etiology.  4. Prostatomegaly with circumferential bladder wall thickening, more  prominent on the left.  5. Aortic  Atherosclerosis (ICD10-I70.0).   Recent Labs: No results found for requested labs within last 365 days.  Recent Lipid Panel    Component Value Date/Time   CHOL 119 01/31/2022 0825   TRIG 104 01/31/2022 0825   HDL 52 01/31/2022 0825   CHOLHDL 2.3 01/31/2022 0825   CHOLHDL 5.9 07/24/2019 0105   VLDL 39 07/24/2019 0105   LDLCALC 48 01/31/2022 0825    Physical Exam:    VS:  BP 104/60 (BP Location: Left Arm, Patient Position: Sitting, Cuff Size: Normal)   Pulse (!) 59   Ht 5\' 8"  (1.727 m)   Wt 176 lb 12.8 oz (80.2 kg)   SpO2 97%   BMI 26.88 kg/m     Wt Readings from Last 3 Encounters:  02/09/23 176 lb 12.8 oz (80.2 kg)  01/31/22 171 lb 12.8 oz (77.9 kg)  09/16/21 176 lb (79.8 kg)     GEN: Well nourished, well developed in no acute distress HEENT: Normal NECK: No JVD; No carotid bruits LYMPHATICS: No lymphadenopathy CARDIAC: S1S2 noted,RRR, no murmurs, rubs, gallops RESPIRATORY:  Clear to auscultation without rales, wheezing or rhonchi  ABDOMEN: Soft, non-tender, non-distended, +bowel sounds, no guarding. EXTREMITIES: No edema, No cyanosis, no clubbing MUSCULOSKELETAL:  No deformity  SKIN: Warm and dry NEUROLOGIC:  Alert and oriented x 3, non-focal PSYCHIATRIC:  Normal affect, good  insight  ASSESSMENT:    1. Coronary artery disease involving native coronary artery of native heart without angina pectoris   2. Hypertension   3. Mixed hyperlipidemia    PLAN:     1.  He appears to be doing well from a cardiovascular standpoint.  We will keep him on the aspirin 81 mg daily, we will cut back the Crestor to 5 mg daily as he is concerned that some of his symptoms are statin related.  Will adjust his statin to every other day.  In 12 weeks he will get blood work at our [facility]  2.  He had CT of the chest abdomen pelvis from 02/25/2023 at [facility] was able to review this with him.  The patient is in agreement with the above plan. The patient left the office in stable condition.  The patient will follow up in 1 year or sooner if needed.   Medication Adjustments/Labs and Tests Ordered: Current medicines are reviewed at length with the patient today.  Concerns regarding medicines are outlined above.  Orders Placed This Encounter  Procedures   EKG 12-Lead   No orders of the defined types were placed in this encounter.   There are no Patient Instructions on file for this visit.   Adopting a Healthy Lifestyle.  Know what a healthy weight is for you (roughly BMI <25) and aim to maintain this   Aim for 7+ servings of fruits and vegetables daily   65-80+ fluid ounces of water or unsweet tea for healthy kidneys   Limit to max 1 drink of alcohol per day; avoid smoking/tobacco   Limit animal fats in diet for cholesterol and heart health - choose grass fed whenever available   Avoid highly processed foods, and foods high in saturated/trans fats   Aim for low stress - take time to unwind and care for your mental health   Aim for 150 min of moderate intensity exercise weekly for heart health, and weights twice weekly for bone health   Aim for 7-9 hours of sleep daily   When it comes to diets, agreement about the perfect plan isnt  easy to find, even among  the experts. Experts at the [facility] [employer] developed an idea known as the Healthy Eating Plate. Just imagine a plate divided into logical, healthy portions.   The emphasis is on diet quality:   Load up on vegetables and fruits - one-half of your plate: Aim for color and variety, and remember that potatoes dont count.   Go for whole grains - one-quarter of your plate: Whole wheat, barley, wheat berries, quinoa, oats, brown rice, and foods made with them. If you want pasta, go with whole wheat pasta.   Protein power - one-quarter of your plate: Fish, chicken, beans, and nuts are all healthy, versatile protein sources. Limit red meat.   The diet, however, does go beyond the plate, offering a few other suggestions.   Use healthy plant oils, such as olive, canola, soy, corn, sunflower and peanut. Check the labels, and avoid partially hydrogenated oil, which have unhealthy trans fats.   If youre thirsty, drink water. Coffee and tea are good in moderation, but skip sugary drinks and limit milk and dairy products to one or two daily servings.   The type of carbohydrate in the diet is more important than the amount. Some sources of carbohydrates, such as vegetables, fruits, whole grains, and beans-are healthier than others.   Finally, stay active  Signed, Jonathon Ripple, DO  02/09/2023 8:41 AM    [facility]

## 2023-10-31 ENCOUNTER — Telehealth: Payer: Self-pay | Admitting: Cardiology

## 2023-10-31 MED ORDER — ROSUVASTATIN CALCIUM 5 MG PO TABS: 5.0000 mg | ORAL_TABLET | ORAL | 3 refills | Status: AC

## 2023-10-31 NOTE — Telephone Encounter (Signed)
 Called patient- ID [id number] Informed that a refill was sent to his pharmacy.  He reports that he has actually been taking 5 mg daily instead of 5 mg every other day and that is why he ran out of his medication so fast. The pharmacy reminded him that it is supposed to be every other day. He asked if the insurance will cover it, or if they will not/due to being too early. I advised him to call his pharmacy to see.  Instructed him to let us  know if he runs into any problems. He verbalized understanding.

## 2023-10-31 NOTE — Telephone Encounter (Signed)
 Pt c/o medication issue:  1. Name of Medication: rosuvastatin  (CRESTOR ) 5 MG tablet (Expired)   2. How are you currently taking this medication (dosage and times per day)? Yes   3. Are you having a reaction (difficulty breathing--STAT)? No   4. What is your medication issue? Pt requesting a refill but the pharmacy stated he doesn't have anymore refills. Please advise

## 2024-03-26 ENCOUNTER — Encounter: Payer: Self-pay | Admitting: *Deleted

## 2024-03-27 ENCOUNTER — Ambulatory Visit: Attending: Cardiology | Admitting: Cardiology

## 2024-03-27 ENCOUNTER — Encounter: Payer: Self-pay | Admitting: Cardiology

## 2024-03-27 VITALS — BP 100/72 | HR 61 | Ht 68.0 in | Wt 173.6 lb

## 2024-03-27 DIAGNOSIS — I77810 Thoracic aortic ectasia: Secondary | ICD-10-CM

## 2024-03-27 DIAGNOSIS — I10 Essential (primary) hypertension: Secondary | ICD-10-CM | POA: Diagnosis not present

## 2024-03-27 DIAGNOSIS — E782 Mixed hyperlipidemia: Secondary | ICD-10-CM | POA: Diagnosis not present

## 2024-03-27 DIAGNOSIS — I2510 Atherosclerotic heart disease of native coronary artery without angina pectoris: Secondary | ICD-10-CM

## 2024-03-27 LAB — LIPID PANEL
Chol/HDL Ratio: 2.8 ratio (ref 0.0–5.0)
Cholesterol, Total: 141 mg/dL (ref 100–199)
HDL: 50 mg/dL (ref >39)
LDL Chol Calc (NIH): 75 mg/dL (ref 0–99)
Triglycerides: 85 mg/dL (ref 0–149)
VLDL Cholesterol Cal: 16 mg/dL (ref 5–40)

## 2024-03-27 NOTE — Progress Notes (Unsigned)
 Cardiology Office Note:    Date:  03/29/2024   ID:  Jonathon Oneill, DOB 1945/08/25, MRN [id number]  PCP:  Keren Vicenta BRAVO, MD  Cardiologist:  Dub Huntsman, DO  Electrophysiologist:  None   Referring MD: Keren Vicenta BRAVO, MD    I am doing well  History of Present Illness:    Jonathon Oneill is a 77 y.o. male with a hx of CAD status post stent to his LAD and RCA placed in January 20,2021 on dual antiplatelet therapy, hyperlipidemia, ischemic cardiomyopathy his EF has improved slightly on his recent echo from 45-50% from 35-40% in 07/2019 and proximal ascending aorta location.    He is here today for follow-up visit.  He had no complaints.     Past Medical History:  Diagnosis Date   Abdominal pain, acute, generalized 04/05/2017   Overview:  Added automatically from request for surgery [phone]  Formatting of this note might be different from the original. Added automatically from request for surgery [id number]   Abnormal CT of the abdomen 04/05/2017   Overview:  Added automatically from request for surgery [id number]  Formatting of this note might be different from the original. Added automatically from request for surgery [id number]   Allergy    CAD (coronary artery disease), native coronary artery 07/24/2019   LHC 07/24/19: DES-pLAD, DES-dRCA   Cancer (HCC) 04/2017   rectal cancer. kidney cancer   Cataract    removed right eye, left eye forming   Chronic kidney disease 04/2017   left renal mass   Chronic systolic heart failure (HCC)    Encounter for antineoplastic chemotherapy 05/03/2017   Epiretinal membrane (ERM) of left eye 05/09/2013   Glaucoma    History of radiation therapy 2018   ended 06-2017   Ischemic cardiomyopathy    35-45% on [facility] 07/24/19   Lamellar macular hole, left eye 05/09/2013   Left renal mass 04/05/2017   Mass of left thigh 03/25/2019   Mixed hyperlipidemia 08/02/2019   Multiple thyroid nodules 06/02/2017   Non-ST elevation (NSTEMI) myocardial infarction  (HCC) 07/23/2019   Nontoxic single thyroid nodule 06/14/2017   Nuclear sclerotic cataract of left eye 01/24/2013   Obesity    Obesity, unspecified 05/21/2012   Primary open-angle glaucoma 04/16/2012   Pseudophakia 09/13/2012   Overview:  S/p YAG CAPS OD ~ 11/2012  Formatting of this note might be different from the original. Overview:  S/p YAG CAPS OD ~ 11/2012 Overview:  Overview:  S/p YAG CAPS OD ~ 11/2012   Pseudophakia of right eye 11/2012   Pulmonary nodules 04/2017   Rectal carcinoma (HCC) 04/18/2017   Retinal break of left eye 04/30/2012   Thyroid nodule 04/2017   multiple    Past Surgical History:  Procedure Laterality Date   COLON SURGERY  08/2017   COLONOSCOPY  04/2017   CORONARY STENT INTERVENTION N/A 07/24/2019   Procedure: CORONARY STENT INTERVENTION;  Surgeon: Dann Candyce RAMAN, MD;  Location: [facility] INVASIVE CV LAB;  Service: Cardiovascular;  Laterality: N/A;   EYE SURGERY     POAG; lamellar macular hole on left; retinal break of left eye; nuclear sclerotic cataract left eye   FINGER SURGERY     IR RADIOLOGIST EVAL & MGMT  06/15/2017   IR RADIOLOGIST EVAL & MGMT  11/28/2017   IR RADIOLOGIST EVAL & MGMT  01/10/2018   IR RADIOLOGIST EVAL & MGMT  04/17/2018   IR RADIOLOGIST EVAL & MGMT  12/05/2019   IR RADIOLOGIST EVAL & MGMT  01/14/2021  IR RADIOLOGIST EVAL & MGMT  12/13/2021   KIDNEY SURGERY     LEFT HEART CATH AND CORONARY ANGIOGRAPHY N/A 07/24/2019   Procedure: LEFT HEART CATH AND CORONARY ANGIOGRAPHY;  Surgeon: Dann Candyce RAMAN, MD;  Location: [facility] INVASIVE CV LAB;  Service: Cardiovascular;  Laterality: N/A;    Current Medications: Current Meds  Medication Sig   aspirin  EC 81 MG tablet Take 1 tablet (81 mg total) by mouth daily.   Cholecalciferol (VITAMIN D3) 2000 units capsule Take 2,000 Units by mouth at bedtime.    Cyanocobalamin (VITAMIN B12 PO) Take by mouth daily.   latanoprost  (XALATAN ) 0.005 % ophthalmic solution Place 1 drop into both eyes at bedtime.     nitroGLYCERIN  (NITROSTAT ) 0.4 MG SL tablet Place 1 tablet (0.4 mg total) under the tongue every 5 (five) minutes x 3 doses as needed for chest pain.   Omega-3 Fatty Acids (FISH OIL) 1200 MG CAPS Take by mouth daily.   rosuvastatin  (CRESTOR ) 5 MG tablet Take 1 tablet (5 mg total) by mouth every other day.     Allergies:   Amoxicillin, Influenza vaccines, and Ciprofloxacin   Social History   Socioeconomic History   Marital status: Married    Spouse name: Not on file   Number of children: 4   Years of education: Not on file   Highest education level: Not on file  Occupational History   Occupation: Retired   Tobacco Use   Smoking status: Never   Smokeless tobacco: Never  Vaping Use   Vaping status: Never Used  Substance and Sexual Activity   Alcohol use: Not Currently   Drug use: Never   Sexual activity: Not on file  Other Topics Concern   Not on file  Social History Narrative   Not on file   Social Drivers of Health   Financial Resource Strain: Not on file  Food Insecurity: Not on file  Transportation Needs: Not on file  Physical Activity: Not on file  Stress: Not on file  Social Connections: Not on file     Family History: The patient's family history includes Colon cancer in his mother; Colon polyps in his mother; Esophageal cancer in his maternal great-grandfather; Rectal cancer in his mother. There is no history of Stomach cancer.  ROS:   Review of Systems  Constitution: Negative for decreased appetite, fever and weight gain.  HENT: Negative for congestion, ear discharge, hoarse voice and sore throat.   Eyes: Negative for discharge, redness, vision loss in right eye and visual halos.  Cardiovascular: Negative for chest pain, dyspnea on exertion, leg swelling, orthopnea and palpitations.  Respiratory: Negative for cough, hemoptysis, shortness of breath and snoring.   Endocrine: Negative for heat intolerance and polyphagia.  Hematologic/Lymphatic: Negative for  bleeding problem. Does not bruise/bleed easily.  Skin: Negative for flushing, nail changes, rash and suspicious lesions.  Musculoskeletal: Negative for arthritis, joint pain, muscle cramps, myalgias, neck pain and stiffness.  Gastrointestinal: Negative for abdominal pain, bowel incontinence, diarrhea and excessive appetite.  Genitourinary: Negative for decreased libido, genital sores and incomplete emptying.  Neurological: Negative for brief paralysis, focal weakness, headaches and loss of balance.  Psychiatric/Behavioral: Negative for altered mental status, depression and suicidal ideas.  Allergic/Immunologic: Negative for HIV exposure and persistent infections.    EKGs/Labs/Other Studies Reviewed:    The following studies were reviewed today:   EKG:  The ekg ordered today demonstrates sinus rhythm   CT Chest 11/11/2021 IMPRESSION:  1. Signs of prior rectal resection and colorectal/colo  anal  anastomosis. No signs of disease recurrence.  2. Post treatment changes about the presacral region without change  compared to previous imaging.  3. No evidence of metastatic disease.  4. Subtle area of added enhancement in the pancreatic head favored  to represent small vessels passing through pancreatic parenchyma.  This appears to a been present as far back as w May of 2020 and  displays no change, attention on follow-up.  5. Stable prostatomegaly and urinary bladder wall thickening.  6. 2.2 cm LEFT thyroid nodule with heterogeneity. Recommend thyroid  US  (ref: J Am Coll Radiol. 2015 Feb;12(2): 143-50).   Aortic Atherosclerosis (ICD10-I70.0).     CT scan done Nov 10, 2020 CT CHEST FINDINGS   Cardiovascular: The heart size is normal. No substantial pericardial  effusion. Coronary artery calcification is evident. Atherosclerotic  calcification is noted in the wall of the thoracic aorta.   Mediastinum/Nodes: 2.1 cm left thyroid nodule stable since chest CT  04/16/2018. No follow-up  imaging recommended (ref: J Am Coll Radiol.  2015 Feb;12(2): 143-50). No mediastinal lymphadenopathy. There is no  hilar lymphadenopathy. The esophagus has normal imaging features.  There is no axillary lymphadenopathy.   Lungs/Pleura: 4 mm right lower lobe nodule on 94/4 is stable since  2019. Tiny right upper lobe pulmonary nodule seen on images 55 and  66 are unchanged since 2019 consistent with benign etiology. No new  suspicious pulmonary nodule or mass. No focal airspace  consolidation. No pleural effusion.   Musculoskeletal: No worrisome lytic or sclerotic osseous  abnormality.   CT ABDOMEN PELVIS FINDINGS   Hepatobiliary: No suspicious focal abnormality within the liver  parenchyma. Tiny hypodensity in the central right liver is stable  consistent with benign etiology. There is no evidence for  gallstones, gallbladder wall thickening, or pericholecystic fluid.  No intrahepatic or extrahepatic biliary dilation.   Pancreas: No focal mass lesion. No dilatation of the main duct. No  intraparenchymal cyst. No peripancreatic edema.   Spleen: No splenomegaly. No focal mass lesion.   Adrenals/Urinary Tract: No adrenal nodule or mass. Right kidney  unremarkable. Stable appearance ablation defect lower interpolar  left kidney. No evidence for hydroureter. Mild circumferential  bladder wall thickening again noted, eccentric to the left.   Stomach/Bowel: Stomach is unremarkable. No gastric wall thickening.  No evidence of outlet obstruction. Duodenum is normally positioned  as is the ligament of Treitz. No small bowel wall thickening. No  small bowel dilatation. Rectal anastomosis is again noted with  sequelae of low anterior section. Surgical bed is stable in  appearance in the interval. Colon is distended with stool.   Vascular/Lymphatic: There is abdominal aortic atherosclerosis  without aneurysm. There is no gastrohepatic or hepatoduodenal  ligament lymphadenopathy. No  retroperitoneal or mesenteric  lymphadenopathy. No pelvic sidewall lymphadenopathy.   Reproductive: Prostate gland appears enlarged.   Other: No intraperitoneal free fluid.   Musculoskeletal: No worrisome lytic or sclerotic osseous  abnormality.   IMPRESSION:  1. Stable exam. No new or progressive findings to suggest recurrent  or metastatic disease in the chest, abdomen, or pelvis.  2. Stable appearance ablation defect lower interpolar left kidney.  3. Stable tiny right pulmonary nodules, consistent with benign  etiology.  4. Prostatomegaly with circumferential bladder wall thickening, more  prominent on the left.  5. Aortic Atherosclerosis (ICD10-I70.0).    Electronically Signed    By: Camellia Candle M.D.    On: 11/11/2020 11:11   Procedure Note   Candle Camellia Fess, MD -  11/11/2020  Formatting of this note might be different from the original.  CLINICAL DATA:  Rectal cancer status post LAR. Also with a history  of left renal cell carcinoma status post cryoablation. Restaging.   EXAM:  CT CHEST, ABDOMEN, AND PELVIS WITH CONTRAST   TECHNIQUE:  Multidetector CT imaging of the chest, abdomen and pelvis was  performed following the standard protocol during bolus  administration of intravenous contrast.   CONTRAST:  80 cc Omnipaque  350   COMPARISON:  Abdomen/pelvis CT 11/18/2019 chest CT 04/16/2018   FINDINGS:  CT CHEST FINDINGS   Cardiovascular: The heart size is normal. No substantial pericardial  effusion. Coronary artery calcification is evident. Atherosclerotic  calcification is noted in the wall of the thoracic aorta.   Mediastinum/Nodes: 2.1 cm left thyroid nodule stable since chest CT  04/16/2018. No follow-up imaging recommended (ref: J Am Coll Radiol.  2015 Feb;12(2): 143-50). No mediastinal lymphadenopathy. There is no  hilar lymphadenopathy. The esophagus has normal imaging features.  There is no axillary lymphadenopathy.   Lungs/Pleura: 4 mm right  lower lobe nodule on 94/4 is stable since  2019. Tiny right upper lobe pulmonary nodule seen on images 55 and  66 are unchanged since 2019 consistent with benign etiology. No new  suspicious pulmonary nodule or mass. No focal airspace  consolidation. No pleural effusion.   Musculoskeletal: No worrisome lytic or sclerotic osseous  abnormality.   CT ABDOMEN PELVIS FINDINGS   Hepatobiliary: No suspicious focal abnormality within the liver  parenchyma. Tiny hypodensity in the central right liver is stable  consistent with benign etiology. There is no evidence for  gallstones, gallbladder wall thickening, or pericholecystic fluid.  No intrahepatic or extrahepatic biliary dilation.   Pancreas: No focal mass lesion. No dilatation of the main duct. No  intraparenchymal cyst. No peripancreatic edema.   Spleen: No splenomegaly. No focal mass lesion.   Adrenals/Urinary Tract: No adrenal nodule or mass. Right kidney  unremarkable. Stable appearance ablation defect lower interpolar  left kidney. No evidence for hydroureter. Mild circumferential  bladder wall thickening again noted, eccentric to the left.   Stomach/Bowel: Stomach is unremarkable. No gastric wall thickening.  No evidence of outlet obstruction. Duodenum is normally positioned  as is the ligament of Treitz. No small bowel wall thickening. No  small bowel dilatation. Rectal anastomosis is again noted with  sequelae of low anterior section. Surgical bed is stable in  appearance in the interval. Colon is distended with stool.   Vascular/Lymphatic: There is abdominal aortic atherosclerosis  without aneurysm. There is no gastrohepatic or hepatoduodenal  ligament lymphadenopathy. No retroperitoneal or mesenteric  lymphadenopathy. No pelvic sidewall lymphadenopathy.   Reproductive: Prostate gland appears enlarged.   Other: No intraperitoneal free fluid.   Musculoskeletal: No worrisome lytic or sclerotic osseous  abnormality.    IMPRESSION:  1. Stable exam. No new or progressive findings to suggest recurrent  or metastatic disease in the chest, abdomen, or pelvis.  2. Stable appearance ablation defect lower interpolar left kidney.  3. Stable tiny right pulmonary nodules, consistent with benign  etiology.  4. Prostatomegaly with circumferential bladder wall thickening, more  prominent on the left.  5. Aortic Atherosclerosis (ICD10-I70.0).   Recent Labs: No results found for requested labs within last 365 days.  Recent Lipid Panel    Component Value Date/Time   CHOL 141 03/27/2024 0933   TRIG 85 03/27/2024 0933   HDL 50 03/27/2024 0933   CHOLHDL 2.8 03/27/2024 0933   CHOLHDL 5.9 07/24/2019 0105  VLDL 39 07/24/2019 0105   LDLCALC 75 03/27/2024 0933    Physical Exam:    VS:  BP 100/72 (BP Location: Left Arm, Patient Position: Sitting, Cuff Size: Normal)   Pulse 61   Ht 5' 8 (1.727 m)   Wt 173 lb 9.6 oz (78.7 kg)   SpO2 95%   BMI 26.40 kg/m     Wt Readings from Last 3 Encounters:  03/27/24 173 lb 9.6 oz (78.7 kg)  02/09/23 176 lb 12.8 oz (80.2 kg)  01/31/22 171 lb 12.8 oz (77.9 kg)     GEN: Well nourished, well developed in no acute distress HEENT: Normal NECK: No JVD; No carotid bruits LYMPHATICS: No lymphadenopathy CARDIAC: S1S2 noted,RRR, no murmurs, rubs, gallops RESPIRATORY:  Clear to auscultation without rales, wheezing or rhonchi  ABDOMEN: Soft, non-tender, non-distended, +bowel sounds, no guarding. EXTREMITIES: No edema, No cyanosis, no clubbing MUSCULOSKELETAL:  No deformity  SKIN: Warm and dry NEUROLOGIC:  Alert and oriented x 3, non-focal PSYCHIATRIC:  Normal affect, good insight  ASSESSMENT:    1. Hypertension, unspecified type   2. Aortic root dilation   3. Mixed hyperlipidemia   4. Coronary artery disease involving native coronary artery of native heart without angina pectoris    PLAN:     1.  He appears to be doing well from a cardiovascular standpoint.  We will  keep him on the aspirin  81 mg daily, continue Crestor  5 mg daily.  2.  Will get a repeat echocardiogram to reassess not only LV function but also for aortic dilatation.  He plans to get this in our [place] office.  3.  Hyperlipidemia - continue with current statin medication.  The patient is in agreement with the above plan. The patient left the office in stable condition.  The patient will follow up in 1 year or sooner if needed.   Medication Adjustments/Labs and Tests Ordered: Current medicines are reviewed at length with the patient today.  Concerns regarding medicines are outlined above.  Orders Placed This Encounter  Procedures   Lipid panel   EKG 12-Lead   ECHOCARDIOGRAM COMPLETE   No orders of the defined types were placed in this encounter.   Patient Instructions  Medication Instructions:  Your physician recommends that you continue on your current medications as directed. Please refer to the Current Medication list given to you today.  *If you need a refill on your cardiac medications before your next appointment, please call your pharmacy*  Lab Work: Lipids If you have labs (blood work) drawn today and your tests are completely normal, you will receive your results only by: MyChart Message (if you have MyChart) OR A paper copy in the mail If you have any lab test that is abnormal or we need to change your treatment, we will call you to review the results.  Testing/Procedures: Your physician has requested that you have an echocardiogram-[place]. Echocardiography is a painless test that uses sound waves to create images of your heart. It provides your doctor with information about the size and shape of your heart and how well your heart's chambers and valves are working. This procedure takes approximately one hour. There are no restrictions for this procedure. Please do NOT wear cologne, perfume, aftershave, or lotions (deodorant is allowed). Please arrive 15 minutes  prior to your appointment time.  Please note: We ask at that you not bring children with you during ultrasound (echo/ vascular) testing. Due to room size and safety concerns, children are not allowed in  the ultrasound rooms during exams. Our front office staff cannot provide observation of children in our lobby area while testing is being conducted. An adult accompanying a patient to their appointment will only be allowed in the ultrasound room at the discretion of the ultrasound technician under special circumstances. We apologize for any inconvenience.   Follow-Up: At [facility], you and your health needs are our priority.  As part of our continuing mission to provide you with exceptional heart care, our providers are all part of one team.  This team includes your primary Cardiologist (physician) and Advanced Practice Providers or APPs (Physician Assistants and Nurse Practitioners) who all work together to provide you with the care you need, when you need it.  Your next appointment:   1 year(s)  Provider:   Lulie Hurd, DO               Adopting a Healthy Lifestyle.  Know what a healthy weight is for you (roughly BMI <25) and aim to maintain this   Aim for 7+ servings of fruits and vegetables daily   65-80+ fluid ounces of water or unsweet tea for healthy kidneys   Limit to max 1 drink of alcohol per day; avoid smoking/tobacco   Limit animal fats in diet for cholesterol and heart health - choose grass fed whenever available   Avoid highly processed foods, and foods high in saturated/trans fats   Aim for low stress - take time to unwind and care for your mental health   Aim for 150 min of moderate intensity exercise weekly for heart health, and weights twice weekly for bone health   Aim for 7-9 hours of sleep daily   When it comes to diets, agreement about the perfect plan isnt easy to find, even among the experts. Experts at the [facility] [employer]  developed an idea known as the Healthy Eating Plate. Just imagine a plate divided into logical, healthy portions.   The emphasis is on diet quality:   Load up on vegetables and fruits - one-half of your plate: Aim for color and variety, and remember that potatoes dont count.   Go for whole grains - one-quarter of your plate: Whole wheat, barley, wheat berries, quinoa, oats, brown rice, and foods made with them. If you want pasta, go with whole wheat pasta.   Protein power - one-quarter of your plate: Fish, chicken, beans, and nuts are all healthy, versatile protein sources. Limit red meat.   The diet, however, does go beyond the plate, offering a few other suggestions.   Use healthy plant oils, such as olive, canola, soy, corn, sunflower and peanut. Check the labels, and avoid partially hydrogenated oil, which have unhealthy trans fats.   If youre thirsty, drink water. Coffee and tea are good in moderation, but skip sugary drinks and limit milk and dairy products to one or two daily servings.   The type of carbohydrate in the diet is more important than the amount. Some sources of carbohydrates, such as vegetables, fruits, whole grains, and beans-are healthier than others.   Finally, stay active  Signed, Dub Huntsman, DO  03/29/2024 10:16 PM    [facility]

## 2024-03-27 NOTE — Patient Instructions (Addendum)
 Medication Instructions:  Your physician recommends that you continue on your current medications as directed. Please refer to the Current Medication list given to you today.  *If you need a refill on your cardiac medications before your next appointment, please call your pharmacy*  Lab Work: Lipids If you have labs (blood work) drawn today and your tests are completely normal, you will receive your results only by: MyChart Message (if you have MyChart) OR A paper copy in the mail If you have any lab test that is abnormal or we need to change your treatment, we will call you to review the results.  Testing/Procedures: Your physician has requested that you have an echocardiogram-[place]. Echocardiography is a painless test that uses sound waves to create images of your heart. It provides your doctor with information about the size and shape of your heart and how well your heart's chambers and valves are working. This procedure takes approximately one hour. There are no restrictions for this procedure. Please do NOT wear cologne, perfume, aftershave, or lotions (deodorant is allowed). Please arrive 15 minutes prior to your appointment time.  Please note: We ask at that you not bring children with you during ultrasound (echo/ vascular) testing. Due to room size and safety concerns, children are not allowed in the ultrasound rooms during exams. Our front office staff cannot provide observation of children in our lobby area while testing is being conducted. An adult accompanying a patient to their appointment will only be allowed in the ultrasound room at the discretion of the ultrasound technician under special circumstances. We apologize for any inconvenience.   Follow-Up: At [facility], you and your health needs are our priority.  As part of our continuing mission to provide you with exceptional heart care, our providers are all part of one team.  This team includes your primary  Cardiologist (physician) and Advanced Practice Providers or APPs (Physician Assistants and Nurse Practitioners) who all work together to provide you with the care you need, when you need it.  Your next appointment:   1 year(s)  Provider:   Kardie Tobb, DO

## 2024-03-29 ENCOUNTER — Telehealth: Payer: Self-pay | Admitting: Cardiology

## 2024-03-29 NOTE — Telephone Encounter (Signed)
 Spoke to patient he was calling for recent lab results.Advised lab results not available.He stated best to call him back early in morning.I will send message to Dr.Tobb's RN.

## 2024-03-29 NOTE — Telephone Encounter (Signed)
 Patient called to follow-up on his lab results and note the best time to reach him is early morning.  Patient is concerned if he will need a medication change.

## 2024-04-02 ENCOUNTER — Ambulatory Visit: Payer: Self-pay | Admitting: Cardiology

## 2024-04-09 ENCOUNTER — Telehealth: Payer: Self-pay | Admitting: Cardiology

## 2024-04-09 NOTE — Telephone Encounter (Signed)
 Patient called to follow-up on lab results and next steps.

## 2024-04-09 NOTE — Telephone Encounter (Signed)
 Results/message from Dr. Emmette Harms has been released to MyChart. A letter is being sent to the last known home address.

## 2024-04-09 NOTE — Telephone Encounter (Signed)
 Called and gave pt results per Dr. Sheena no change in your medicine at this time. Pt verbalized an understanding

## 2024-04-22 ENCOUNTER — Ambulatory Visit: Attending: Cardiology

## 2024-04-22 DIAGNOSIS — I080 Rheumatic disorders of both mitral and aortic valves: Secondary | ICD-10-CM

## 2024-04-22 DIAGNOSIS — I77810 Thoracic aortic ectasia: Secondary | ICD-10-CM | POA: Diagnosis not present

## 2024-04-22 LAB — ECHOCARDIOGRAM COMPLETE
AR max vel: 3.25 cm2
AV Area VTI: 3.22 cm2
AV Area mean vel: 3.17 cm2
AV Mean grad: 3 mmHg
AV Peak grad: 5.5 mmHg
Ao pk vel: 1.18 m/s
Area-P 1/2: 2.25 cm2
MV VTI: 1.49 cm2
P 1/2 time: 570 ms
S' Lateral: 2.9 cm

## 2024-04-26 ENCOUNTER — Telehealth: Payer: Self-pay | Admitting: Cardiology

## 2024-04-26 NOTE — Telephone Encounter (Signed)
 Spoke to patient . Informed echo result have not been resulted by Dr Sheena. Once available  the patient will be contacted by rhona ,mail or phone  Patient verbalized understanding

## 2024-04-26 NOTE — Telephone Encounter (Signed)
Patient called to follow-up on test results. 

## 2024-05-24 ENCOUNTER — Ambulatory Visit: Payer: Self-pay

## 2024-05-24 NOTE — Telephone Encounter (Signed)
 FYI Only or Action Required?: FYI only for provider: appointment scheduled on 06/03/24.  Patient was last seen in primary care on NA New Patient.  Called Nurse Triage reporting Abdominal Pain.  Symptoms began several years ago.  Interventions attempted: Dietary changes.  Symptoms are: stable.  Triage Disposition: See Physician Within 24 Hours  Patient/caregiver understands and will follow disposition?: No, refuses disposition  Copied from CRM #[id number]. Topic: Clinical - Red Word Triage >> May 24, 2024  1:11 PM Kevelyn M wrote: New Patient-worsening symptoms - Had cancer surgery 8 years ago. Experiencing pain in area where re-attached. He feels there is a blockage and he experiences pains. He's not in pain now, but experienced pain after eating asparagus yesterday. Pain on right side where the scar is. Reason for Disposition  [1] MODERATE pain (e.g., interferes with normal activities) AND [2] pain comes and goes (cramps) AND [3] present > 24 hours  (Exception: Pain with Vomiting or Diarrhea - see that Guideline.)  Answer Assessment - Initial Assessment Questions Patient has been following up with his oncologist, calls in stating provider recommended he establish care with PCP, pt had ultrasound imaging done for symptoms, RN made new patient appt 06/03/24 and recommended [facility] or [facility] within 24 hours and pt declined, states he will go if symptoms worsen  1. LOCATION: Where does it hurt?      Right side abdomen It's where I had the surgery at 2. RADIATION: Does the pain shoot anywhere else? (e.g., chest, back)      Localized to old incision site 3. ONSET: When did the pain begin? (Minutes, hours or days ago)      Gradual; worsened over the last few years 4. SUDDEN: Gradual or sudden onset?     Gradual 5. PATTERN Does the pain come and go, or is it constant?     Comes and goes; Worsens after eating 6. SEVERITY: How bad is the pain?  (e.g., Scale 1-10; mild,  moderate, or severe)     No pain today; moderate pain when present 7. RECURRENT SYMPTOM: Have you ever had this type of stomach pain before? If Yes, ask: When was the last time? and What happened that time?      Yes, pt states pain has been ongoing post ostomy surgery and reversal, had previous surgery 8 years  8. CAUSE: What do you think is causing the stomach pain? (e.g., gallstones, recent abdominal surgery)     Thinks it may be scar tissue from at previous ostomy site, thinks food/stool is having difficulty passing through area 9. RELIEVING/AGGRAVATING FACTORS: What makes it better or worse? (e.g., antacids, bending or twisting motion, bowel movement)     Pain worsens after eating certain foods, Not taking any medications 10. OTHER SYMPTOMS: Do you have any other symptoms? (e.g., back pain, diarrhea, fever, urination pain, vomiting)       Denies constipation, diarrhea, nausea, vomiting, blood in stool  Protocols used: Abdominal Pain - Male-A-AH

## 2024-06-03 ENCOUNTER — Ambulatory Visit (HOSPITAL_BASED_OUTPATIENT_CLINIC_OR_DEPARTMENT_OTHER): Admitting: Family Medicine

## 2024-06-03 ENCOUNTER — Encounter (HOSPITAL_BASED_OUTPATIENT_CLINIC_OR_DEPARTMENT_OTHER): Payer: Self-pay | Admitting: Family Medicine

## 2024-06-03 VITALS — BP 132/85 | HR 72 | Temp 97.6°F | Resp 16 | Ht 68.0 in | Wt 180.0 lb

## 2024-06-03 DIAGNOSIS — I2510 Atherosclerotic heart disease of native coronary artery without angina pectoris: Secondary | ICD-10-CM

## 2024-06-03 DIAGNOSIS — I10 Essential (primary) hypertension: Secondary | ICD-10-CM | POA: Diagnosis not present

## 2024-06-03 DIAGNOSIS — E782 Mixed hyperlipidemia: Secondary | ICD-10-CM | POA: Diagnosis not present

## 2024-06-03 DIAGNOSIS — R1903 Right lower quadrant abdominal swelling, mass and lump: Secondary | ICD-10-CM | POA: Insufficient documentation

## 2024-06-03 DIAGNOSIS — M1991 Primary osteoarthritis, unspecified site: Secondary | ICD-10-CM

## 2024-06-03 DIAGNOSIS — M1990 Unspecified osteoarthritis, unspecified site: Secondary | ICD-10-CM | POA: Insufficient documentation

## 2024-06-03 NOTE — Progress Notes (Signed)
 New Patient Office Visit  Subjective    Patient ID: Jonathon Oneill, male    DOB: 02/05/1946  Age: 78 y.o. MRN: [id number]  CC:  Chief Complaint  Patient presents with   Establish Care    Establish Care     HPI Jonathon Oneill presents to establish care F/u as above.  New to my practice.  He has ongoing frustration in his right lower abdomen pain after somewhat recent abdominal surgery.  Had the surgery in [place]  and the pain only started after the surgery.  His PMH is extensive and was reviewed in detail today.  He had a diverting Colostomy which was ultimately closed up years ago.  He has been moving his bowels w/o difficulty.  No fevers, nausea, or other acute concerns.  Now sees [facility] regularly.  Outpatient Encounter Medications as of 06/03/2024  Medication Sig   aspirin  EC 81 MG tablet Take 1 tablet (81 mg total) by mouth daily.   brimonidine (ALPHAGAN) 0.2 % ophthalmic solution Place 1 drop into both eyes 2 (two) times daily.   Cholecalciferol (VITAMIN D3) 2000 units capsule Take 2,000 Units by mouth at bedtime.    Cyanocobalamin (VITAMIN B12 PO) Take by mouth daily.   latanoprost  (XALATAN ) 0.005 % ophthalmic solution Place 1 drop into both eyes at bedtime.    nitroGLYCERIN  (NITROSTAT ) 0.4 MG SL tablet Place 1 tablet (0.4 mg total) under the tongue every 5 (five) minutes x 3 doses as needed for chest pain.   Omega-3 Fatty Acids (FISH OIL) 1200 MG CAPS Take by mouth daily.   rosuvastatin  (CRESTOR ) 5 MG tablet Take 1 tablet (5 mg total) by mouth every other day.   No facility-administered encounter medications on file as of 06/03/2024.    Past Medical History:  Diagnosis Date   Aortic root dilatation    CAD (coronary artery disease), native coronary artery 07/24/2019   f/by Dr. Sheena   Chronic systolic heart failure [facility])    Glaucoma    History of radiation therapy 2018   ended 06-2017   History of rectal cancer    f/by [facility]   History of renal cell  carcinoma    (Left) Suspected with ablation at [facility]   Ischemic cardiomyopathy    35-45% on LHC 07/24/19   Mixed hyperlipidemia 08/02/2019   Obesity, unspecified 05/21/2012   Prostatic hypertrophy    Pulmonary nodules 04/2017   Benign   Retinal break of left eye 04/30/2012   Thyroid nodule 04/2017   Benign per [facility]    Past Surgical History:  Procedure Laterality Date   COLON SURGERY  08/2017   COLONOSCOPY  04/2017   CORONARY STENT INTERVENTION N/A 07/24/2019   Procedure: CORONARY STENT INTERVENTION;  Surgeon: Dann Candyce RAMAN, MD;  Location: [facility] INVASIVE CV LAB;  Service: Cardiovascular;  Laterality: N/A;   EYE SURGERY     POAG; lamellar macular hole on left; retinal break of left eye; nuclear sclerotic cataract left eye   FINGER SURGERY     IR RADIOLOGIST EVAL & MGMT  06/15/2017   IR RADIOLOGIST EVAL & MGMT  11/28/2017   IR RADIOLOGIST EVAL & MGMT  01/10/2018   IR RADIOLOGIST EVAL & MGMT  04/17/2018   IR RADIOLOGIST EVAL & MGMT  12/05/2019   IR RADIOLOGIST EVAL & MGMT  01/14/2021   IR RADIOLOGIST EVAL & MGMT  12/13/2021   KIDNEY SURGERY     LEFT HEART CATH AND CORONARY ANGIOGRAPHY N/A 07/24/2019   Procedure: LEFT  HEART CATH AND CORONARY ANGIOGRAPHY;  Surgeon: Dann Candyce RAMAN, MD;  Location: [facility] INVASIVE CV LAB;  Service: Cardiovascular;  Laterality: N/A;    Family History  Problem Relation Age of Onset   Colon cancer Mother    Colon polyps Mother    Rectal cancer Mother    Esophageal cancer Maternal Great-grandfather    Stomach cancer Neg Hx     Social History   Tobacco Use   Smoking status: Never   Smokeless tobacco: Never  Vaping Use   Vaping status: Never Used  Substance Use Topics   Alcohol use: Not Currently   Drug use: Never    Review of Systems  Constitutional:  Negative for diaphoresis, fever, malaise/fatigue and weight loss.  Respiratory:  Negative for cough, shortness of breath and wheezing.   Cardiovascular:  Negative for chest pain, palpitations,  orthopnea, claudication, leg swelling and PND.  Gastrointestinal:  Positive for abdominal pain. Negative for constipation, diarrhea, heartburn and nausea.  Genitourinary:  Negative for dysuria and urgency.        Objective    BP 132/85 (BP Location: Right Arm, Patient Position: Standing, Cuff Size: Normal)   Pulse 72   Temp 97.6 F (36.4 C) (Oral)   Resp 16   Ht 5' 8 (1.727 m)   Wt 180 lb (81.6 kg)   BMI 27.37 kg/m   Physical Exam Constitutional:      General: He is not in acute distress.    Appearance: Normal appearance.  HENT:     Head: Normocephalic.  Neck:     Vascular: No carotid bruit.  Cardiovascular:     Rate and Rhythm: Normal rate and regular rhythm.     Pulses: Normal pulses.     Heart sounds: Normal heart sounds.  Pulmonary:     Effort: Pulmonary effort is normal.     Breath sounds: Normal breath sounds.  Abdominal:     General: Bowel sounds are normal. There is no distension.     Palpations: Abdomen is soft.     Tenderness: There is abdominal tenderness. There is no guarding.     Comments: Mild RLQ tenderness with surgical scar noted.  Small mass adjacent to surgical scar.  Musculoskeletal:     Cervical back: Neck supple. No tenderness.     Right lower leg: No edema.     Left lower leg: No edema.  Neurological:     Mental Status: He is alert.         Assessment & Plan:  Abdominal mass, RLQ (right lower quadrant) Assessment & Plan: Unclear etiology.  I encouraged him to see Dr. Joesph locally to clarify if repeat imaging is needed.  Patient declines for now.  Recent labs from [facility] noted, though those records are limited.  He isn't especially interested in labs today.  Continue to review his somewhat extensive records.   Coronary artery disease involving native coronary artery of native heart without angina pectoris Assessment & Plan: Stable.  F/u with Dr. Sheena as directed.   Primary hypertension Assessment & Plan: Well controlled.  DASH  diet.  Continue current rx.   Mixed hyperlipidemia Assessment & Plan: Continue statin therapy.   Primary osteoarthritis, unspecified site Assessment & Plan: Mild.  Symptomatic rx prn.     Return in about 4 weeks (around 07/01/2024) for chronic follow-up.   REDDING PONCE NORLEEN FALCON., MD

## 2024-06-03 NOTE — Assessment & Plan Note (Signed)
 Mild.  Symptomatic rx prn.

## 2024-06-03 NOTE — Assessment & Plan Note (Signed)
 Well controlled.  DASH diet.  Continue current rx.

## 2024-06-03 NOTE — Assessment & Plan Note (Signed)
 Stable.  F/u with Dr. Sheena as directed.

## 2024-06-03 NOTE — Assessment & Plan Note (Signed)
 Continue statin therapy

## 2024-06-03 NOTE — Assessment & Plan Note (Addendum)
 Unclear etiology.  I encouraged him to see Dr. Joesph locally to clarify if repeat imaging is needed.  Patient declines for now.  Recent labs from [facility] noted, though those records are limited.  He isn't especially interested in labs today.  Continue to review his somewhat extensive records.

## 2024-06-09 ENCOUNTER — Ambulatory Visit (HOSPITAL_BASED_OUTPATIENT_CLINIC_OR_DEPARTMENT_OTHER)
Admission: EM | Admit: 2024-06-09 | Discharge: 2024-06-09 | Disposition: A | Discharge status: Home / Self Care | Attending: Family Medicine | Admitting: Family Medicine

## 2024-06-09 ENCOUNTER — Encounter (HOSPITAL_BASED_OUTPATIENT_CLINIC_OR_DEPARTMENT_OTHER): Payer: Self-pay

## 2024-06-09 DIAGNOSIS — R42 Dizziness and giddiness: Secondary | ICD-10-CM

## 2024-06-09 LAB — POCT URINE DIPSTICK
Bilirubin, UA: NEGATIVE
Blood, UA: NEGATIVE
Glucose, UA: NEGATIVE mg/dL
Ketones, POC UA: NEGATIVE mg/dL
Leukocytes, UA: NEGATIVE
Nitrite, UA: NEGATIVE
Protein Ur, POC: NEGATIVE mg/dL
Spec Grav, UA: 1.015 (ref 1.010–1.025)
Urobilinogen, UA: 0.2 U/dL
pH, UA: 7 (ref 5.0–8.0)

## 2024-06-09 NOTE — ED Provider Notes (Signed)
 [facility]    CSN: [id number] Arrival date & time: 06/09/24  1453      History   Chief Complaint Chief Complaint  Patient presents with   Dizziness    HPI Jonathon Oneill is a 78 y.o. male.   Pt states that he has been having some dizziness today. The dizziness is constant even when he is sitting sill. Pt denies DM, ear issues, congestion, or hx of vertigo. He ate lunch thinking that it would help but it has not made it better. He has not taken any medications today.    Dizziness   Past Medical History:  Diagnosis Date   Aortic root dilatation    CAD (coronary artery disease), native coronary artery 07/24/2019   f/by Dr. Sheena   Chronic systolic heart failure [facility])    Glaucoma    History of radiation therapy 2018   ended 06-2017   History of rectal cancer    f/by [facility]   History of renal cell carcinoma    (Left) Suspected with ablation at [facility]   Ischemic cardiomyopathy    35-45% on LHC 07/24/19   Mixed hyperlipidemia 08/02/2019   Obesity, unspecified 05/21/2012   Prostatic hypertrophy    Pulmonary nodules 04/2017   Benign   Retinal break of left eye 04/30/2012   Thyroid nodule 04/2017   Benign per [facility]    Patient Active Problem List   Diagnosis Date Noted   Abdominal mass, RLQ (right lower quadrant) 06/03/2024   Osteoarthritis 06/03/2024   Other neutropenia 06/02/2022   Allergy    Cataract    Glaucoma    Hypertension 10/14/2019   Thoracic aortic aneurysm without rupture 10/14/2019   Dizziness 10/14/2019   Mixed hyperlipidemia 08/02/2019   Ischemic cardiomyopathy 07/25/2019   Chronic systolic heart failure (HCC) 07/25/2019   CAD (coronary artery disease), native coronary artery 07/25/2019   Non-ST elevation (NSTEMI) myocardial infarction (HCC) 07/23/2019   Mass of left thigh 03/25/2019   Nontoxic single thyroid nodule 06/14/2017   Multiple thyroid nodules 06/02/2017   Pulmonary nodules 06/02/2017   Encounter for antineoplastic  chemotherapy 05/03/2017   Rectal carcinoma (HCC) 04/18/2017   Abdominal pain, acute, generalized 04/05/2017   Abnormal CT of the abdomen 04/05/2017   Left renal mass 04/05/2017   Chronic kidney disease 04/2017   Cancer (HCC) 04/2017   Thyroid nodule 04/2017   Lamellar macular hole, left eye 05/09/2013   Epiretinal membrane (ERM) of left eye 05/09/2013   Nuclear sclerotic cataract of left eye 01/24/2013   Pseudophakia of right eye 11/2012   Pseudophakia 09/13/2012   Obesity, unspecified 05/21/2012   Obesity 05/21/2012   Retinal break of left eye 04/30/2012   Retinal detachment, right 04/30/2012   PVD (posterior vitreous detachment), left eye 04/30/2012   Primary open-angle glaucoma 04/16/2012    Past Surgical History:  Procedure Laterality Date   COLON SURGERY  08/2017   COLONOSCOPY  04/2017   CORONARY STENT INTERVENTION N/A 07/24/2019   Procedure: CORONARY STENT INTERVENTION;  Surgeon: Dann Candyce RAMAN, MD;  Location: [facility] INVASIVE CV LAB;  Service: Cardiovascular;  Laterality: N/A;   EYE SURGERY     POAG; lamellar macular hole on left; retinal break of left eye; nuclear sclerotic cataract left eye   FINGER SURGERY     IR RADIOLOGIST EVAL & MGMT  06/15/2017   IR RADIOLOGIST EVAL & MGMT  11/28/2017   IR RADIOLOGIST EVAL & MGMT  01/10/2018   IR RADIOLOGIST EVAL & MGMT  04/17/2018  IR RADIOLOGIST EVAL & MGMT  12/05/2019   IR RADIOLOGIST EVAL & MGMT  01/14/2021   IR RADIOLOGIST EVAL & MGMT  12/13/2021   KIDNEY SURGERY     LEFT HEART CATH AND CORONARY ANGIOGRAPHY N/A 07/24/2019   Procedure: LEFT HEART CATH AND CORONARY ANGIOGRAPHY;  Surgeon: Dann Candyce RAMAN, MD;  Location[facility] INVASIVE CV LAB;  Service: Cardiovascular;  Laterality: N/A;       Home Medications    Prior to Admission medications   Medication Sig Start Date End Date Taking? Authorizing Provider  aspirin  EC 81 MG tablet Take 1 tablet (81 mg total) by mouth daily. 07/23/19   Tobb, Kardie, DO  brimonidine  (ALPHAGAN) 0.2 % ophthalmic solution Place 1 drop into both eyes 2 (two) times daily. 05/21/24   [provider]  Cholecalciferol (VITAMIN D3) 2000 units capsule Take 2,000 Units by mouth at bedtime.     [provider]  Cyanocobalamin (VITAMIN B12 PO) Take by mouth daily.    [provider]  latanoprost  (XALATAN ) 0.005 % ophthalmic solution Place 1 drop into both eyes at bedtime.  03/21/16   [provider]  nitroGLYCERIN  (NITROSTAT ) 0.4 MG SL tablet Place 1 tablet (0.4 mg total) under the tongue every 5 (five) minutes x 3 doses as needed for chest pain. 01/31/22   Tobb, Kardie, DO  Omega-3 Fatty Acids (FISH OIL) 1200 MG CAPS Take by mouth daily.    [provider]  rosuvastatin  (CRESTOR ) 5 MG tablet Take 1 tablet (5 mg total) by mouth every other day. 10/31/23   Tobb, Kardie, DO    Family History Family History  Problem Relation Age of Onset   Colon cancer Mother    Colon polyps Mother    Rectal cancer Mother    Esophageal cancer Maternal Great-grandfather    Stomach cancer Neg Hx     Social History Social History   Tobacco Use   Smoking status: Never   Smokeless tobacco: Never  Vaping Use   Vaping status: Never Used  Substance Use Topics   Alcohol use: Not Currently   Drug use: Never     Allergies   Amoxicillin, Influenza vaccines, and Ciprofloxacin   Review of Systems Review of Systems  Neurological:  Positive for dizziness.     Physical Exam Triage Vital Signs ED Triage Vitals  Encounter Vitals Group     BP 06/09/24 1520 121/80     Girls Systolic BP Percentile --      Girls Diastolic BP Percentile --      Boys Systolic BP Percentile --      Boys Diastolic BP Percentile --      Pulse Rate 06/09/24 1520 67     Resp 06/09/24 1520 20     Temp 06/09/24 1520 97.7 F (36.5 C)     Temp Source 06/09/24 1520 Oral     SpO2 06/09/24 1520 98 %     Weight --      Height --      Head Circumference --      Peak Flow --       Pain Score 06/09/24 1519 0     Pain Loc --      Pain Education --      Exclude from Growth Chart --    No data found.  Updated Vital Signs BP 121/80 (BP Location: Right Arm)   Pulse 67   Temp 97.7 F (36.5 C) (Oral)   Resp 20   SpO2 98%  Visual Acuity Right Eye Distance:   Left Eye Distance:   Bilateral Distance:    Right Eye Near:   Left Eye Near:    Bilateral Near:     Physical Exam   UC Treatments / Results  Labs (all labs ordered are listed, but only abnormal results are displayed) Labs Reviewed - No data to display  EKG   Radiology No results found.  Procedures Procedures (including critical care time)  Medications Ordered in UC Medications - No data to display  Initial Impression / Assessment and Plan / UC Course  I have reviewed the triage vital signs and the nursing notes.  Pertinent labs & imaging results that were available during my care of the patient were reviewed by me and considered in my medical decision making (see chart for details).     *** Final Clinical Impressions(s) / UC Diagnoses   Final diagnoses:  None   Discharge Instructions   None    ED Prescriptions   None    PDMP not reviewed this encounter.

## 2024-06-09 NOTE — ED Triage Notes (Signed)
 Pt states that he has been having some dizziness today. The dizziness is constant even when he is sitting sill. Pt denies DM, ear issues, congestion, or hx of vertigo. He ate lunch thinking that it would help but it has not made it better. He has not taken any medications today.

## 2024-06-09 NOTE — Discharge Instructions (Signed)
 Urine was normal.  I am not seeing anything specifically concerning on the exam today.  Would recommend if the dizziness worsens or does not subside will need to go to the ER for a CT scan. Make sure you are drinking plenty of fluids to include Gatorade with electrolytes.

## 2024-06-11 ENCOUNTER — Ambulatory Visit: Admitting: Gastroenterology

## 2024-06-11 ENCOUNTER — Encounter: Payer: Self-pay | Admitting: Gastroenterology

## 2024-06-11 VITALS — BP 110/78 | HR 68 | Ht 68.0 in | Wt 182.2 lb

## 2024-06-11 DIAGNOSIS — Z85048 Personal history of other malignant neoplasm of rectum, rectosigmoid junction, and anus: Secondary | ICD-10-CM

## 2024-06-11 DIAGNOSIS — R1011 Right upper quadrant pain: Secondary | ICD-10-CM

## 2024-06-11 NOTE — Patient Instructions (Addendum)
 _______________________________________________________  If your blood pressure at your visit was 140/90 or greater, please contact your primary care physician to follow up on this.  _______________________________________________________  If you are age 77 or older, your body mass index should be between 23-30. Your Body mass index is 27.7 kg/m. If this is out of the aforementioned range listed, please consider follow up with your Primary Care Provider.  If you are age 32 or younger, your body mass index should be between 19-25. Your Body mass index is 27.7 kg/m. If this is out of the aformentioned range listed, please consider follow up with your Primary Care Provider.   ________________________________________________________  The [facility] providers would like to encourage you to use MYCHART to communicate with providers for non-urgent requests or questions.  Due to long hold times on the telephone, sending your provider a message by [facility] may be a faster and more efficient way to get a response.  Please allow 48 business hours for a response.  Please remember that this is for non-urgent requests.  _______________________________________________________  [facility] is using a team-based approach to care.  Your team is made up of your doctor and two to three APPS. Our APPS (Nurse Practitioners and Physician Assistants) work with your physician to ensure care continuity for you. They are fully qualified to address your health concerns and develop a treatment plan. They communicate directly with your gastroenterologist to care for you. Seeing the Advanced Practice Practitioners on your physician's team can help you by facilitating care more promptly, often allowing for earlier appointments, access to diagnostic testing, procedures, and other specialty referrals.   Repeat colonoscopy for 09-2024. Please call 2 months prior to schedule this. A letter will be sent as it gets  closer.  Thank you,  Dr. Lynnie Bring

## 2024-06-11 NOTE — Progress Notes (Signed)
 Chief Complaint: For colonoscopy  Referring Provider:  Dottie Norleen PHEBE PONCE, MD      ASSESSMENT AND PLAN;   #1. RLQ Abdo pain (resolved). Neg CT AP 11/2023 for recurrence of rectal CA.  #2. H/O Rectal cancer (stage II) s/p neo-adjuvant chemo-XRT followed by LAR 08/2017 with diverting colostomy, followed by takedown of colostomy May 2019 at [facility] (Dr Thedora Loader). Neg CT 04/2018 for recurrence. Followed by Dr Pacholke/Dr Leverette. Nl CEA level 11/2023. Neg colon 09/2021 for recurrence, but with limited prep.  #2. FH colon cancer (mom at age 90).  #3. H/O Polyps (TAs) on index colonoscopy 04/2017.  #4. Left renal mass s/p cryoablation under CT guidance (Dr Majel).  Plan: -Colon 09/2024 with 2 day prep -Continue current diet.  Increase water intake. -If any recurrence of pain, then stat CT AP.  Otherwise, conservative management.    HPI:     History of Present Illness Jonathon Oneill is a 78 year old male  With RLQ/RMQ abdominal pain  He experiences sharp abdominal pain at the site of his previous bowel reattachment, describing it as similar to the sensation of needing to defecate. The pain was particularly severe on one occasion but subsided after he felt a 'marble just pass through.' He associates the pain with eating asparagus, which he normally tolerates well, but this time it 'really hung in and didn't want to pass.' He has not experienced similar issues in the past.  Now much better.  The pain has resolved.  Underwent CT Abdo/pelvis May 2025 without any recurrence.  He had normal appendix at that time.  He had normal CBC, CMP, CEA level.  He is scheduled to have follow-up colonoscopy in March 2026 with 2-day prep.  Overall currently doing well without any problems.  Denies having any nausea, vomiting, heartburn, regurgitation, odynophagia or dysphagia.  Past GI workup: Most recent colonoscopy 09/2021 - One 2 mm polyp in the proximal transverse colon, removed with  a cold snare. Resected and retrieved. - Patent end- to- end low- anterior anastomosis, characterized by erosion/ granulation tissue. Biopsied. - Mild sigmoid diverticulosis - The examination was otherwise normal. - 70 to 75% of chronic mucus was visualized satisfactory due to quality of prep.  Recommended to repeat in 3 years with 2-day prep.  CT abdomen/pelvis with contrast 11/2023: Negative for any recurrence.  No bowel obstruction.  Normal CEA level.   SH: Daughter is a patient of ours as well. Past Medical History:  Diagnosis Date   Aortic root dilatation    CAD (coronary artery disease), native coronary artery 07/24/2019   f/by Dr. Sheena   Chronic systolic heart failure [facility])    Glaucoma    History of radiation therapy 2018   ended 06-2017   History of rectal cancer    f/by [facility]   History of renal cell carcinoma    (Left) Suspected with ablation at [facility]   Ischemic cardiomyopathy    35-45% on LHC 07/24/19   Mixed hyperlipidemia 08/02/2019   Obesity, unspecified 05/21/2012   Prostatic hypertrophy    Pulmonary nodules 04/2017   Benign   Retinal break of left eye 04/30/2012   Thyroid nodule 04/2017   Benign per [facility]    Past Surgical History:  Procedure Laterality Date   COLON SURGERY  08/2017   COLONOSCOPY  04/2017   CORONARY STENT INTERVENTION N/A 07/24/2019   Procedure: CORONARY STENT INTERVENTION;  Surgeon: Dann Candyce RAMAN, MD;  Location: [facility] INVASIVE CV LAB;  Service: Cardiovascular;  Laterality: N/A;   EYE SURGERY     POAG; lamellar macular hole on left; retinal break of left eye; nuclear sclerotic cataract left eye   FINGER SURGERY     IR RADIOLOGIST EVAL & MGMT  06/15/2017   IR RADIOLOGIST EVAL & MGMT  11/28/2017   IR RADIOLOGIST EVAL & MGMT  01/10/2018   IR RADIOLOGIST EVAL & MGMT  04/17/2018   IR RADIOLOGIST EVAL & MGMT  12/05/2019   IR RADIOLOGIST EVAL & MGMT  01/14/2021   IR RADIOLOGIST EVAL & MGMT  12/13/2021   KIDNEY SURGERY     LEFT HEART CATH AND  CORONARY ANGIOGRAPHY N/A 07/24/2019   Procedure: LEFT HEART CATH AND CORONARY ANGIOGRAPHY;  Surgeon: Dann Candyce RAMAN, MD;  Location: [facility] INVASIVE CV LAB;  Service: Cardiovascular;  Laterality: N/A;    Family History  Problem Relation Age of Onset   Colon cancer Mother    Colon polyps Mother    Rectal cancer Mother    Esophageal cancer Maternal Great-grandfather    Stomach cancer Neg Hx     Social History   Tobacco Use   Smoking status: Never   Smokeless tobacco: Never  Vaping Use   Vaping status: Never Used  Substance Use Topics   Alcohol use: Not Currently   Drug use: Never    Current Outpatient Medications  Medication Sig Dispense Refill   aspirin  EC 81 MG tablet Take 1 tablet (81 mg total) by mouth daily. 90 tablet 3   brimonidine (ALPHAGAN) 0.2 % ophthalmic solution Place 1 drop into both eyes 2 (two) times daily. (Patient taking differently: Place 1 drop into the right eye 2 (two) times daily.)     Cholecalciferol (VITAMIN D3) 2000 units capsule Take 2,000 Units by mouth at bedtime.      Cyanocobalamin (VITAMIN B12 PO) Take by mouth daily.     latanoprost  (XALATAN ) 0.005 % ophthalmic solution Place 1 drop into both eyes at bedtime.      nitroGLYCERIN  (NITROSTAT ) 0.4 MG SL tablet Place 1 tablet (0.4 mg total) under the tongue every 5 (five) minutes x 3 doses as needed for chest pain. 25 tablet 3   Omega-3 Fatty Acids (FISH OIL) 1200 MG CAPS Take by mouth daily.     rosuvastatin  (CRESTOR ) 5 MG tablet Take 1 tablet (5 mg total) by mouth every other day. 45 tablet 3   No current facility-administered medications for this visit.    Allergies  Allergen Reactions   Amoxicillin Diarrhea   Influenza Vaccines Nausea Only   Ciprofloxacin Rash    Review of Systems:  neg     Physical Exam:    BP 110/78   Pulse 68   Ht 5' 8 (1.727 m)   Wt 182 lb 3.2 oz (82.6 kg)   BMI 27.70 kg/m  Filed Weights   06/11/24 1506  Weight: 182 lb 3.2 oz (82.6 kg)   Constitutional:   Well-developed, in no acute distress. Psychiatric: Normal mood and affect. Behavior is normal. HEENT: Pupils normal.  Conjunctivae are normal. No scleral icterus. Cardiovascular: Normal rate, regular rhythm. No edema Pulmonary/chest: Effort normal and breath sounds normal. No wheezing, rales or rhonchi. Abdominal: Soft, nondistended. Nontender. Bowel sounds active throughout. There are no masses palpable. No hepatomegaly.  Well-healed surgical scars.  Some divarication of recti.  No definite hernias. Neurological: Alert and oriented to person place and time. Skin: Skin is warm and dry. No rashes noted.   Anselm Bring, MD 06/11/2024, 3:19 PM  Cc: Dr. Leverette

## 2024-07-01 ENCOUNTER — Encounter (HOSPITAL_BASED_OUTPATIENT_CLINIC_OR_DEPARTMENT_OTHER): Payer: Self-pay | Admitting: Family Medicine

## 2024-07-01 ENCOUNTER — Ambulatory Visit (HOSPITAL_BASED_OUTPATIENT_CLINIC_OR_DEPARTMENT_OTHER): Admitting: Family Medicine

## 2024-07-01 VITALS — BP 113/79 | HR 78 | Temp 98.0°F | Resp 16 | Wt 183.0 lb

## 2024-07-01 DIAGNOSIS — M1991 Primary osteoarthritis, unspecified site: Secondary | ICD-10-CM

## 2024-07-01 DIAGNOSIS — E785 Hyperlipidemia, unspecified: Secondary | ICD-10-CM | POA: Diagnosis not present

## 2024-07-01 DIAGNOSIS — J069 Acute upper respiratory infection, unspecified: Secondary | ICD-10-CM | POA: Diagnosis not present

## 2024-07-01 NOTE — Progress Notes (Signed)
 "  Established Patient Office Visit  Subjective   Patient ID: Jonathon Oneill, male    DOB: October 07, 1945  Age: 78 y.o. MRN: [id number]  Chief Complaint  Patient presents with   Follow-up    Follow-up     Discussed the use of AI scribe software for clinical note transcription with the patient, who gave verbal consent to proceed.  History of Present Illness Jonathon Oneill is a 78 year old male who presents for follow-up regarding dietary management and sinus issues.  He is cautious with his diet and we discussed healthy options.  He now focuses on a diet rich in antioxidants and avoids fats, sugar, and salt.  He experiences sinus issues, attributing them to allergies rather than the flu, despite his daughter's suspicion of the latter. Historically, he had allergy problems in the spring and fall, but now experiences them in the winter. He avoids over-the-counter medications due to concerns about their effects on his heart, particularly decongestants, and opts for rest instead. His sinus problem is nearly resolved, and he denies having a fever.  I reviewed safe options like plain Mucinex.      Past Medical History:  Diagnosis Date   Aortic root dilatation    CAD (coronary artery disease), native coronary artery 07/24/2019   f/by Dr. Sheena   Chronic systolic heart failure (HCC)    Glaucoma    f/by eye doctor   History of radiation therapy 2018   ended 06-2017   History of rectal cancer    f/by [facility]   History of renal cell carcinoma    (Left) Suspected with ablation at [facility]   Ischemic cardiomyopathy    35-45% on LHC 07/24/19   Mixed hyperlipidemia 08/02/2019   Obesity, unspecified 05/21/2012   Prostatic hypertrophy    Pulmonary nodules 04/2017   Benign   Thyroid nodule 04/2017   Benign per [facility]    Outpatient Encounter Medications as of 07/01/2024  Medication Sig   aspirin  EC 81 MG tablet Take 1 tablet (81 mg total) by mouth daily.   Cholecalciferol (VITAMIN  D3) 2000 units capsule Take 2,000 Units by mouth at bedtime.    Cyanocobalamin (VITAMIN B12 PO) Take by mouth daily.   latanoprost  (XALATAN ) 0.005 % ophthalmic solution Place 1 drop into both eyes at bedtime.    nitroGLYCERIN  (NITROSTAT ) 0.4 MG SL tablet Place 1 tablet (0.4 mg total) under the tongue every 5 (five) minutes x 3 doses as needed for chest pain.   Omega-3 Fatty Acids (FISH OIL) 1200 MG CAPS Take by mouth daily.   rosuvastatin  (CRESTOR ) 5 MG tablet Take 1 tablet (5 mg total) by mouth every other day.   brimonidine (ALPHAGAN) 0.2 % ophthalmic solution Place 1 drop into both eyes 2 (two) times daily. (Patient taking differently: Place 1 drop into the right eye 2 (two) times daily.)   No facility-administered encounter medications on file as of 07/01/2024.    Social History[1]    Review of Systems  Constitutional:  Negative for diaphoresis, fever, malaise/fatigue and weight loss.  Respiratory:  Negative for cough, shortness of breath and wheezing.   Cardiovascular:  Negative for chest pain, palpitations, orthopnea, claudication, leg swelling and PND.      Objective:     BP 113/79 (BP Location: Right Arm, Patient Position: Standing, Cuff Size: Normal)   Pulse 78   Temp 98 F (36.7 C) (Oral)   Resp 16   Wt 183 lb (83 kg)   SpO2 98%  BMI 27.83 kg/m    Physical Exam Constitutional:      General: He is not in acute distress.    Appearance: Normal appearance.     Comments: Overweight  HENT:     Head: Normocephalic.     Nose: Nose normal.     Mouth/Throat:     Pharynx: Oropharynx is clear.  Neck:     Vascular: No carotid bruit.  Cardiovascular:     Rate and Rhythm: Normal rate and regular rhythm.     Pulses: Normal pulses.     Heart sounds: Normal heart sounds.  Pulmonary:     Effort: Pulmonary effort is normal.     Breath sounds: Normal breath sounds.  Abdominal:     General: Bowel sounds are normal.     Palpations: Abdomen is soft.  Musculoskeletal:      Cervical back: Neck supple. No tenderness.     Right lower leg: No edema.     Left lower leg: No edema.  Skin:    Findings: No lesion.     Comments: Exam confined to above the waist.  Neurological:     Mental Status: He is alert.      No results found for any visits on 07/01/24.    The ASCVD Risk score (Arnett DK, et al., 2019) failed to calculate for the following reasons:   Risk score cannot be calculated because patient has a medical history suggesting prior/existing ASCVD   * - Cholesterol units were assumed    Assessment & Plan:   Assessment & Plan Viral upper respiratory tract infection Recent episode of viral upper respiratory tract infection, likely allergy-related, with symptoms resolving. No fever or active infection. Advised to avoid decongestants due to potential impact on blood pressure and heart rate. - Use plain Mucinex for symptomatic relief - Avoid decongestants     Dyslipidemia Controlled.  Continue Rosuvastatin  and also followed by Cardiology.  Commended on compliance.     Primary osteoarthritis, unspecified site Clinically controlled.  Tylenol  prn.        Return in about 6 months (around 12/30/2024) for chronic follow-up.    REDDING PONCE NORLEEN FALCON., MD    [1]  Social History Tobacco Use   Smoking status: Never   Smokeless tobacco: Never  Vaping Use   Vaping status: Never Used  Substance Use Topics   Alcohol use: Not Currently   Drug use: Never   "

## 2024-07-01 NOTE — Assessment & Plan Note (Addendum)
 Clinically controlled.  Tylenol  prn.

## 2024-07-01 NOTE — Assessment & Plan Note (Signed)
 Recent episode of viral upper respiratory tract infection, likely allergy-related, with symptoms resolving. No fever or active infection. Advised to avoid decongestants due to potential impact on blood pressure and heart rate. - Use plain Mucinex for symptomatic relief - Avoid decongestants

## 2024-07-01 NOTE — Assessment & Plan Note (Addendum)
 Controlled.  Continue Rosuvastatin  and also followed by Cardiology.  Commended on compliance.

## 2024-07-08 ENCOUNTER — Telehealth: Payer: Self-pay

## 2024-07-08 DIAGNOSIS — Z85048 Personal history of other malignant neoplasm of rectum, rectosigmoid junction, and anus: Secondary | ICD-10-CM

## 2024-07-08 MED ORDER — NA SULFATE-K SULFATE-MG SULF 17.5-3.13-1.6 GM/177ML PO SOLN: 1.0000 | Freq: Once | ORAL | 0 refills | Status: AC

## 2024-07-08 NOTE — Telephone Encounter (Signed)
 Patient will need to be called on 1-12 at 8am  Instructions done and mailed and amb ref in  He will give me the insurance id when I call him on the 12th in case something has changed

## 2024-07-15 NOTE — Telephone Encounter (Signed)
 Patient received instructions through the mail and he will look them over and get miralax and he has the prep already. Will call him Friday at [facility]

## 2024-07-23 NOTE — Telephone Encounter (Signed)
 Spoke with patient and he is concerned that he may be going to the bathroom when he comes up here and asked if he could use an imodium  and I told him no because we want him to be cleaned out for the procedure and imodium  will counteract that. I told him that he can start the prep a hour earlier if he needs to but with the 3 hours prior nothing by mouth hopefully that will slow things down but he says that once he starts to go at times his body just keeps going and he is afraid that he can't stop. He admitted he did ok last time and I told him that his 2 day prep isnt a real 2 day so its not as bad but we need him to be cleaned out for the procedure. Please advise

## 2024-07-25 NOTE — Telephone Encounter (Signed)
 Agreed He can start prep early RG

## 2024-07-26 NOTE — Telephone Encounter (Signed)
 LVM for patient to call back. ?

## 2024-07-26 NOTE — Telephone Encounter (Signed)
 LVM

## 2024-08-01 NOTE — Telephone Encounter (Signed)
 Patient made aware. No imodium  but can start a little earlier

## 2024-09-03 ENCOUNTER — Encounter: Admitting: Gastroenterology
